# Patient Record
Sex: Female | Born: 1985
Health system: Southern US, Community
[De-identification: ages and names within clinical notes are randomized; demographics above are authoritative.]

## PROBLEM LIST (undated history)

## (undated) DIAGNOSIS — Z8669 Personal history of other diseases of the nervous system and sense organs: Secondary | ICD-10-CM

## (undated) DIAGNOSIS — R87629 Unspecified abnormal cytological findings in specimens from vagina: Secondary | ICD-10-CM

## (undated) HISTORY — PX: WISDOM TOOTH EXTRACTION: SHX21

## (undated) HISTORY — DX: Personal history of other diseases of the nervous system and sense organs: Z86.69

## (undated) HISTORY — DX: Unspecified abnormal cytological findings in specimens from vagina: R87.629

---

## 2009-12-30 ENCOUNTER — Ambulatory Visit: Payer: Self-pay | Admitting: Obstetrics and Gynecology

## 2011-10-06 ENCOUNTER — Ambulatory Visit: Payer: Self-pay | Admitting: Obstetrics and Gynecology

## 2011-10-06 LAB — URINALYSIS, COMPLETE
Blood: NEGATIVE
Leukocyte Esterase: NEGATIVE
Nitrite: NEGATIVE
Protein: NEGATIVE
Specific Gravity: 1.005 (ref 1.003–1.030)

## 2011-10-06 LAB — TSH: Thyroid Stimulating Horm: 2.53 u[IU]/mL

## 2011-10-06 LAB — CBC WITH DIFFERENTIAL/PLATELET
Basophil #: 0 10*3/uL (ref 0.0–0.1)
Eosinophil #: 0.1 10*3/uL (ref 0.0–0.7)
HGB: 12.8 g/dL (ref 12.0–16.0)
Lymphocyte #: 1.9 10*3/uL (ref 1.0–3.6)
MCH: 30.3 pg (ref 26.0–34.0)
MCV: 92 fL (ref 80–100)
Monocyte %: 8.1 %
Neutrophil #: 3.9 10*3/uL (ref 1.4–6.5)
Platelet: 210 10*3/uL (ref 150–440)
RBC: 4.22 10*6/uL (ref 3.80–5.20)
WBC: 6.4 10*3/uL (ref 3.6–11.0)

## 2011-10-06 LAB — HEMOGLOBIN A1C: Hemoglobin A1C: 4.9 % (ref 4.2–6.3)

## 2011-10-07 LAB — URINE CULTURE

## 2012-05-22 ENCOUNTER — Inpatient Hospital Stay: Payer: Self-pay | Admitting: Obstetrics and Gynecology

## 2012-05-23 LAB — HEMATOCRIT: HCT: 28.9 % — ABNORMAL LOW (ref 35.0–47.0)

## 2012-08-20 ENCOUNTER — Ambulatory Visit: Payer: Self-pay | Admitting: Surgery

## 2013-11-23 IMAGING — US ULTRASOUND RIGHT BREAST
1 series · 14 of 25 positions shown · non-contrast
Comparison: none

REASON FOR EXAM: RT BRST LUMP 12 TO 3
COMMENTS:

[Series 1: ultrasound right breast · 0.08mm/px · 14 of 43 slices shown]
[im 1/43]
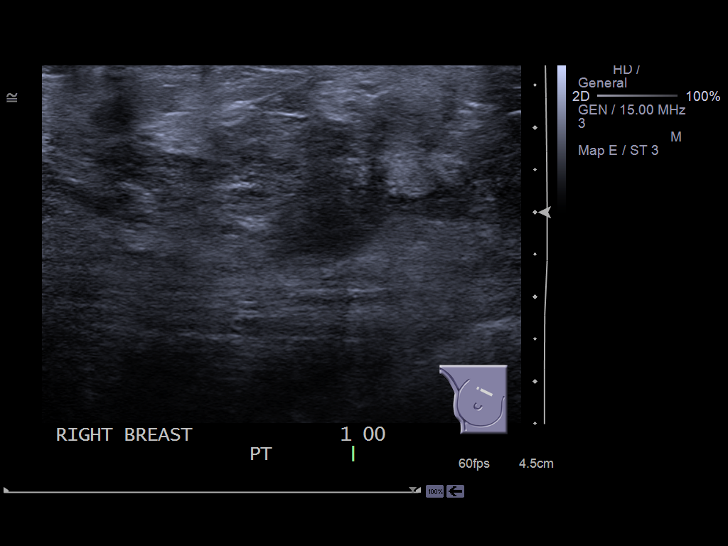
[im 4/43]
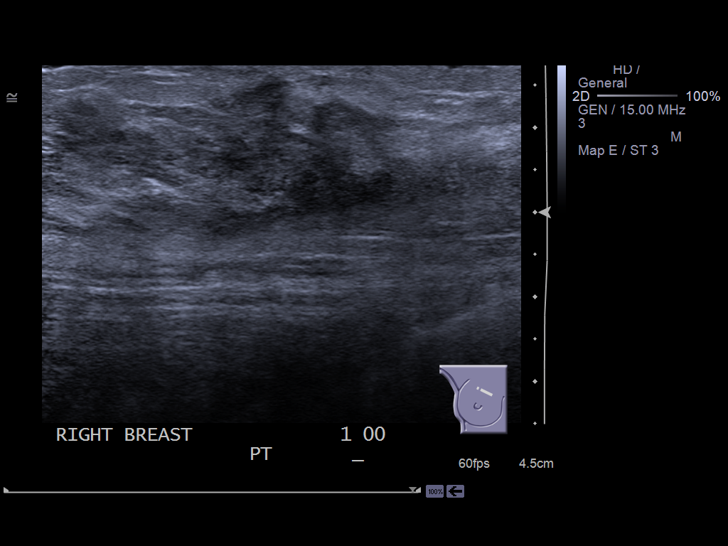
[im 8/43]
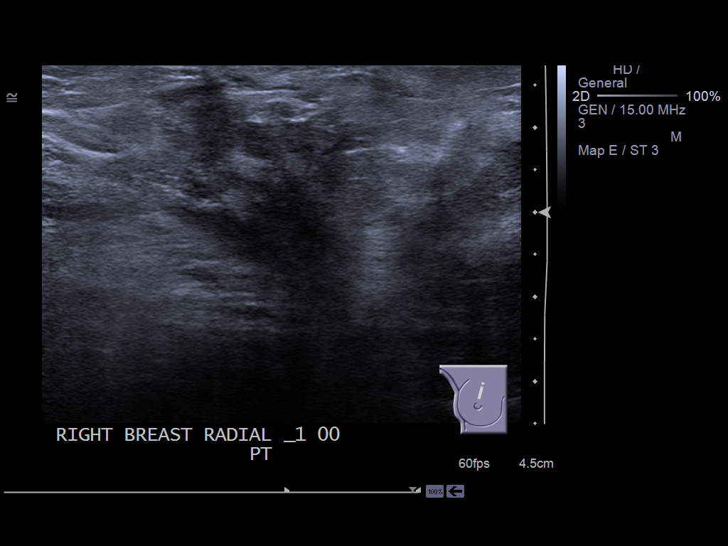
[im 11/43]
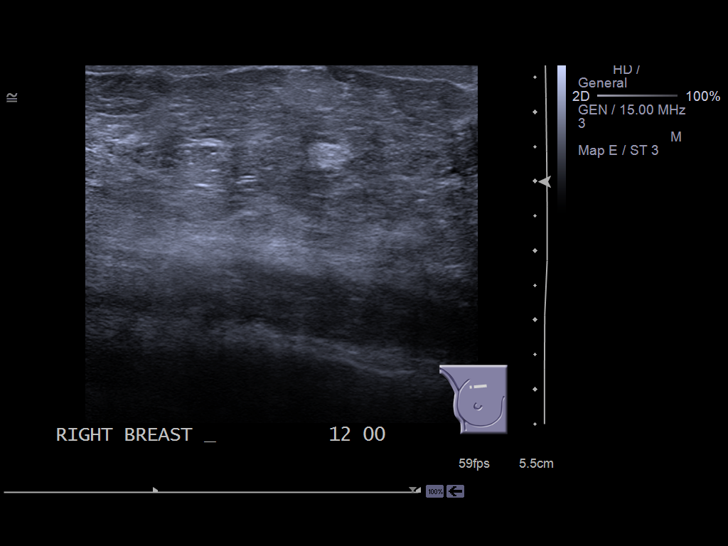
[im 15/43]
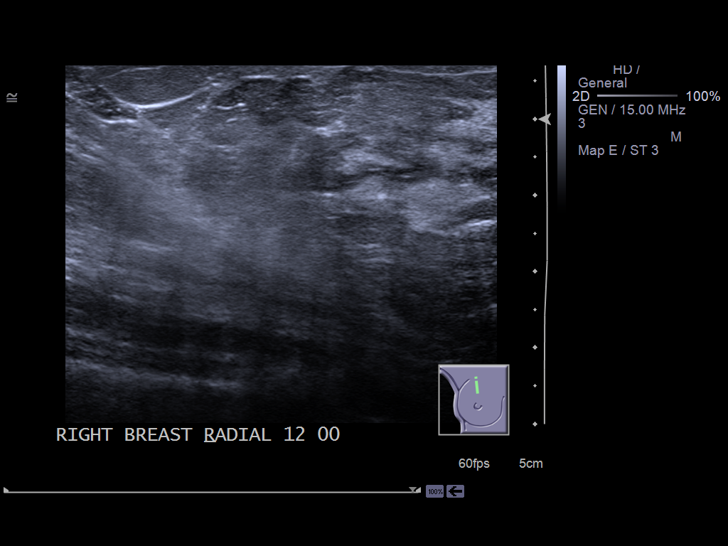
[im 16/43]
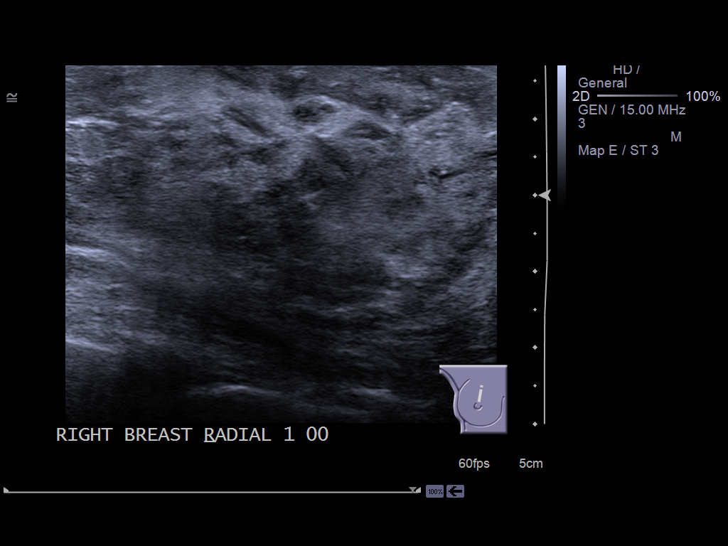
[im 20/43]
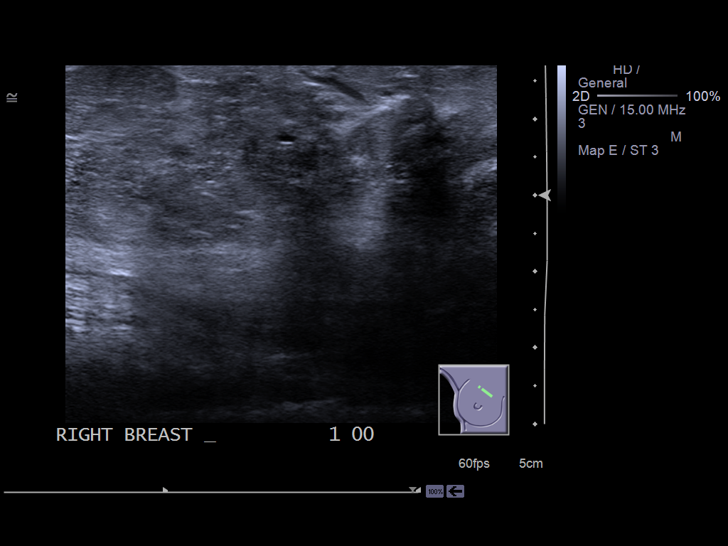
[im 23/43]
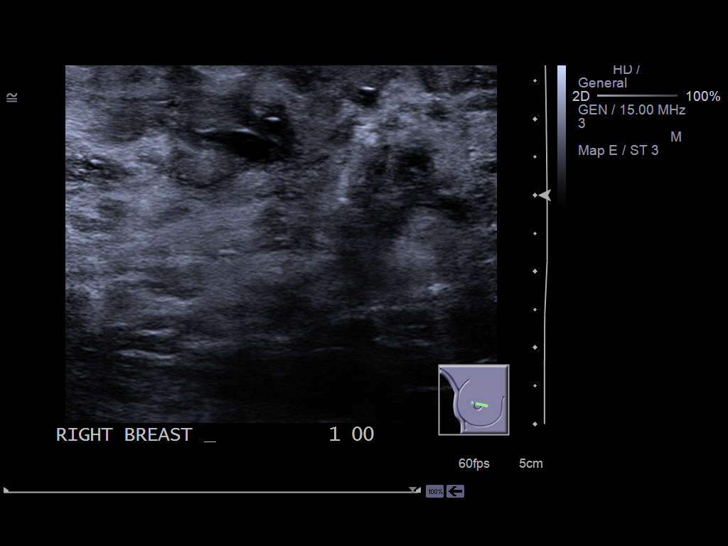
[im 27/43]
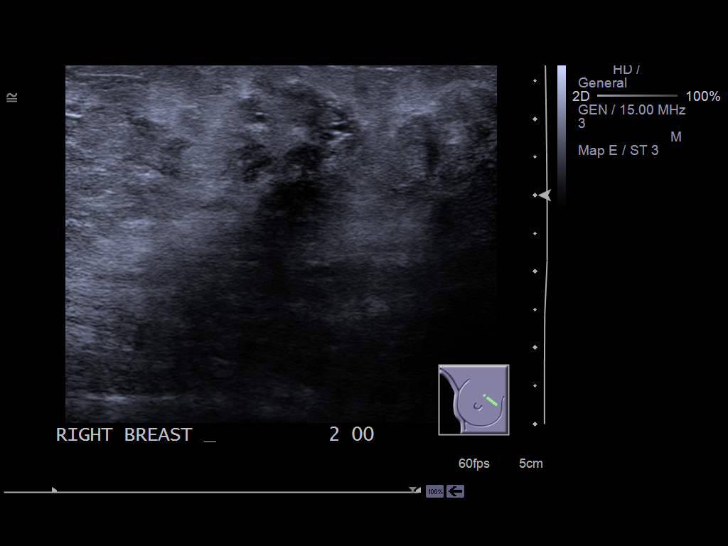
[im 29/43]
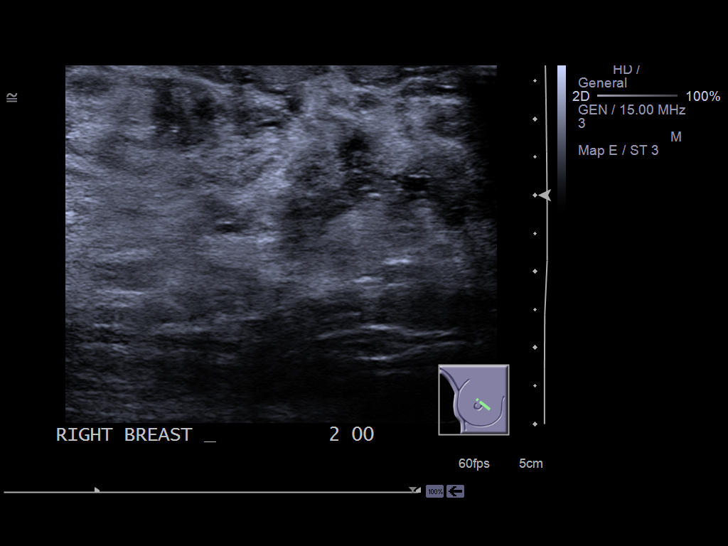
[im 32/43]
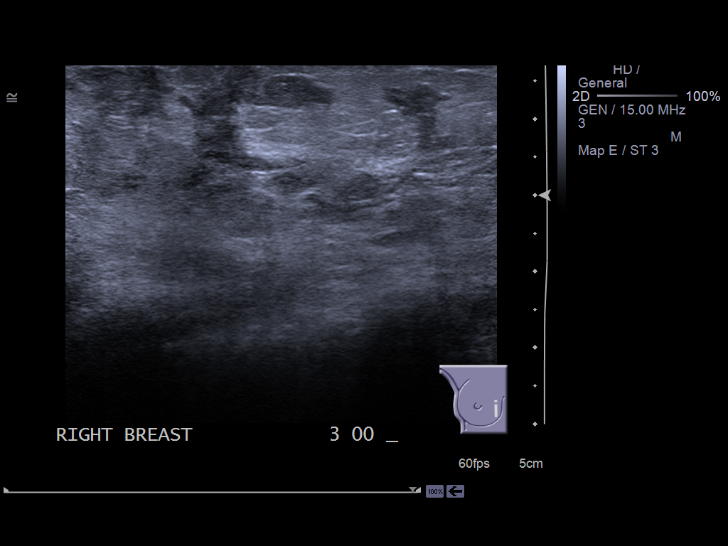
[im 36/43]
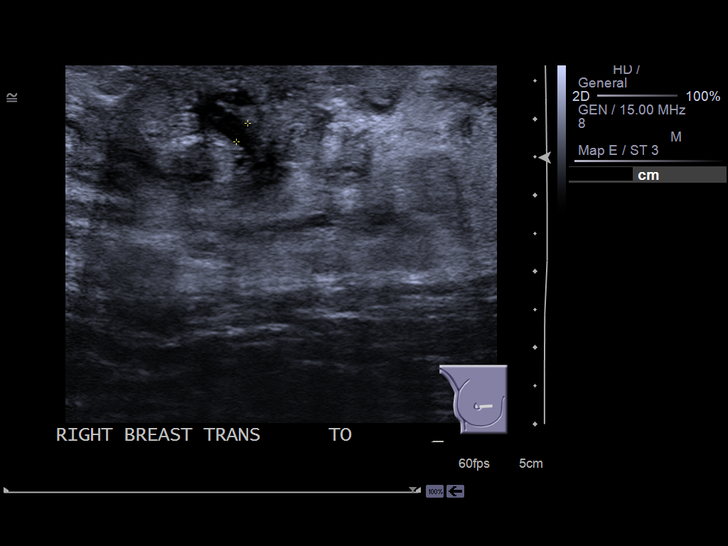
[im 39/43]
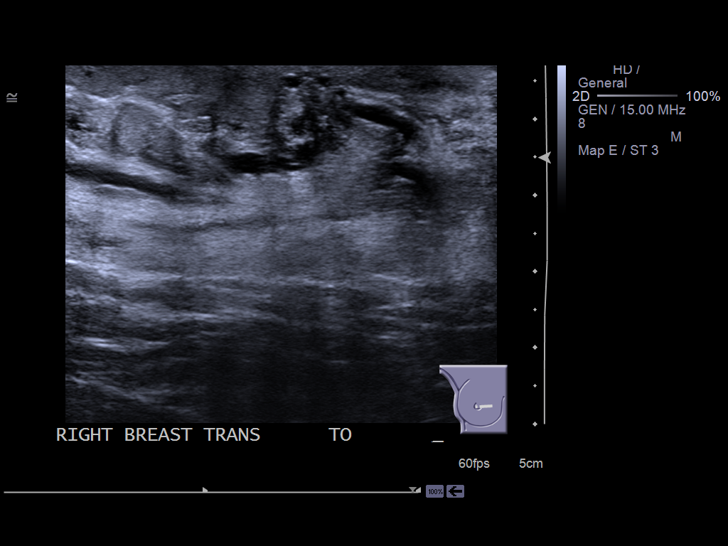
[im 43/43]
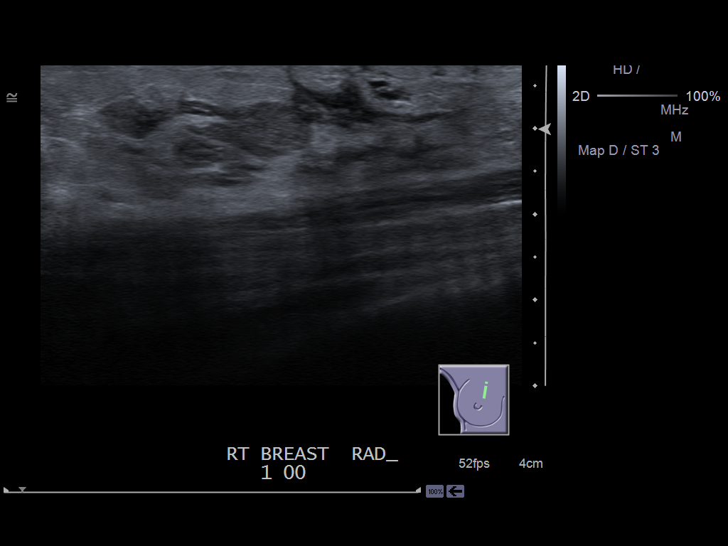

[14 of 25 positions shown; findings below may reference images not displayed]

PROCEDURE:     US  - US BREAST RIGHT  - August 20, 2012  [DATE]

RESULT:     Targeted ultrasound of the right breast upper medial aspect is
performed. The patient has an area of concern approximately the [DATE] region.
There is a dense parenchymal pattern. The patient feels that the area
concern is intermittent with waxing and waning periods. No definite
abnormality is seen in the area of concern. In the retroareolar region deep
to the nipple there is a prominent tubular structure measuring 2.8 mm
diameter suggestive of a dilated duct. The patient is currently
breast-feeding.
IMPRESSION: No definite mass evident. Please see above.

[REDACTED]

## 2014-03-03 ENCOUNTER — Encounter: Payer: Self-pay | Admitting: Family Medicine

## 2014-03-03 ENCOUNTER — Encounter (INDEPENDENT_AMBULATORY_CARE_PROVIDER_SITE_OTHER): Payer: Self-pay

## 2014-03-03 ENCOUNTER — Ambulatory Visit (INDEPENDENT_AMBULATORY_CARE_PROVIDER_SITE_OTHER): Payer: 59 | Admitting: Family Medicine

## 2014-03-03 VITALS — BP 126/80 | HR 70 | Temp 98.8°F | Ht 63.5 in | Wt 140.0 lb

## 2014-03-03 DIAGNOSIS — Z8669 Personal history of other diseases of the nervous system and sense organs: Secondary | ICD-10-CM

## 2014-03-03 NOTE — Progress Notes (Signed)
Subjective:    Patient ID: Caitlyn Blackburn, female    DOB: [birth year], 28 y.o.   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  Pretty regular hours - 8-4:30   Has one child - 48 month old little boy -- in day care  She is enjoying him  She is married - husb works in [employer]   Is very healthy  Hx of migraines -now and then - triggered from food / greasy food  Usually they get better with sleep   Pap was in July 2015  Will try for another pregnancy in another several years  Sees Dr Greggory Keen  Had one abn pap 2 y ago - then re check was fine  No smoking / no tab products  Did lab work a year ago - thyroid /lipid/cbc -all was normal   Does exercise - likes to run  Runs at lunchtime  Eats healthy also   Patient Active Problem List   Diagnosis Date Noted  . Hx of migraines 03/03/2014   Past Medical History  Diagnosis Date  . History of migraine    No past surgical history on file. History  Substance Use Topics  . Smoking status: Never Smoker   . Smokeless tobacco: Not on file  . Alcohol Use: Yes     Comment: occ   Family History  Problem Relation Age of Onset  . Alcohol abuse Maternal Grandfather   . Arthritis Maternal Grandmother   . Breast cancer Paternal Grandmother   . Breast cancer Maternal Aunt   . Prostate cancer Maternal Grandfather   . Hyperlipidemia Maternal Grandmother   . Hypertension Maternal Grandmother    No Known Allergies No current outpatient prescriptions on file prior to visit.   No current facility-administered medications on file prior to visit.     Review of Systems Review of Systems  Constitutional: Negative for fever, appetite change, fatigue and unexpected weight change.  Eyes: Negative for pain and visual disturbance.  Respiratory: Negative for cough and shortness of breath.   Cardiovascular: Negative for cp or palpitations      Gastrointestinal: Negative for nausea, diarrhea and constipation.  Genitourinary: Negative for urgency and frequency.  Skin: Negative for pallor or rash   Neurological: Negative for weakness, light-headedness, numbness and pos for occ headaches.  Hematological: Negative for adenopathy. Does not bruise/bleed easily.  Psychiatric/Behavioral: Negative for dysphoric mood. The patient is not nervous/anxious.         Objective:   Physical Exam  Constitutional: She appears well-developed and well-nourished. No distress.  HENT:  Head: Normocephalic and atraumatic.  Right Ear: External ear normal.  Left Ear: External ear normal.  Nose: Nose normal.  Mouth/Throat: Oropharynx is clear and moist.  Eyes: Conjunctivae and EOM are normal. Pupils are equal, round, and reactive to light. Right eye exhibits no discharge. Left eye exhibits no discharge. No scleral icterus.  Neck: Normal range of motion. Neck supple. No JVD present. No thyromegaly present.  Cardiovascular: Normal rate, regular rhythm, normal heart sounds and intact distal pulses.  Exam reveals no gallop.   Pulmonary/Chest: Effort normal and breath sounds normal. No respiratory distress. She has no wheezes. She has no rales.  Abdominal: Soft. Bowel sounds are normal. She exhibits no distension and no mass. There is no tenderness.  Musculoskeletal: She exhibits no edema and no tenderness.  Lymphadenopathy:    She has no cervical adenopathy.  Neurological: She is alert. She has normal reflexes. No cranial nerve deficit. She exhibits normal muscle tone. Coordination normal.  Skin: Skin is warm and dry. No rash noted. No erythema. No pallor.  Psychiatric: She has a normal mood and affect.          Assessment & Plan:   Problem List Items Addressed This Visit     Other   Hx of migraines - Primary     Triggered by food- urged to keep a diet journal  Disc ha prev with lifestyle change -imp of regular sleep habits and hydration and  caffeine avoidance

## 2014-03-03 NOTE — Patient Instructions (Signed)
Take care of yourself  Drink lots of water  Keep exercising  Don't overuse caffeine  Make sure to get enough sleep  Please call for records from Dr Greggory Keen   Migraine Headache A migraine headache is an intense, throbbing pain on one or both sides of your head. A migraine can last for 30 minutes to several hours. CAUSES  The exact cause of a migraine headache is not always known. However, a migraine may be caused when nerves in the brain become irritated and release chemicals that cause inflammation. This causes pain. Certain things may also trigger migraines, such as:  Alcohol.  Smoking.  Stress.  Menstruation.  Aged cheeses.  Foods or drinks that contain nitrates, glutamate, aspartame, or tyramine.  Lack of sleep.  Chocolate.  Caffeine.  Hunger.  Physical exertion.  Fatigue.  Medicines used to treat chest pain (nitroglycerine), birth control pills, estrogen, and some blood pressure medicines. SIGNS AND SYMPTOMS  Pain on one or both sides of your head.  Pulsating or throbbing pain.  Severe pain that prevents daily activities.  Pain that is aggravated by any physical activity.  Nausea, vomiting, or both.  Dizziness.  Pain with exposure to bright lights, loud noises, or activity.  General sensitivity to bright lights, loud noises, or smells. Before you get a migraine, you may get warning signs that a migraine is coming (aura). An aura may include:  Seeing flashing lights.  Seeing bright spots, halos, or zigzag lines.  Having tunnel vision or blurred vision.  Having feelings of numbness or tingling.  Having trouble talking.  Having muscle weakness. DIAGNOSIS  A migraine headache is often diagnosed based on:  Symptoms.  Physical exam.  A CT scan or MRI of your head. These imaging tests cannot diagnose migraines, but they can help rule out other causes of headaches. TREATMENT Medicines may be given for pain and nausea. Medicines can also be  given to help prevent recurrent migraines.  HOME CARE INSTRUCTIONS  Only take over-the-counter or prescription medicines for pain or discomfort as directed by your health care provider. The use of long-term narcotics is not recommended.  Lie down in a dark, quiet room when you have a migraine.  Keep a journal to find out what may trigger your migraine headaches. For example, write down:  What you eat and drink.  How much sleep you get.  Any change to your diet or medicines.  Limit alcohol consumption.  Quit smoking if you smoke.  Get 7-9 hours of sleep, or as recommended by your health care provider.  Limit stress.  Keep lights dim if bright lights bother you and make your migraines worse. SEEK IMMEDIATE MEDICAL CARE IF:   Your migraine becomes severe.  You have a fever.  You have a stiff neck.  You have vision loss.  You have muscular weakness or loss of muscle control.  You start losing your balance or have trouble walking.  You feel faint or pass out.  You have severe symptoms that are different from your first symptoms. MAKE SURE YOU:   Understand these instructions.  Will watch your condition.  Will get help right away if you are not doing well or get worse. Document Released: 05/29/2005 Document Revised: 10/13/2013 Document Reviewed: 02/03/2013 [facility] Patient Information 2015 [place], [place]. This information is not intended to replace advice given to you by your health care provider. Make sure you discuss any questions you have with your health care provider.

## 2014-03-03 NOTE — Progress Notes (Signed)
Pre visit review using our clinic review tool, if applicable. No additional management support is needed unless otherwise documented below in the visit note. 

## 2014-03-05 NOTE — Assessment & Plan Note (Signed)
Triggered by food- urged to keep a diet journal  Disc ha prev with lifestyle change -imp of regular sleep habits and hydration and caffeine avoidance

## 2014-11-20 ENCOUNTER — Encounter: Payer: Self-pay | Admitting: Family Medicine

## 2014-11-20 ENCOUNTER — Telehealth: Payer: Self-pay | Admitting: Family Medicine

## 2014-11-20 MED ORDER — SUMATRIPTAN SUCCINATE 100 MG PO TABS: 100.0000 mg | ORAL_TABLET | ORAL | Status: DC | PRN

## 2014-11-20 NOTE — Telephone Encounter (Signed)
Pt needs imitrex sent to her pharmacy

## 2015-01-14 ENCOUNTER — Encounter: Payer: Self-pay | Admitting: Obstetrics and Gynecology

## 2015-02-04 ENCOUNTER — Encounter: Payer: Self-pay | Admitting: Obstetrics and Gynecology

## 2015-02-04 ENCOUNTER — Ambulatory Visit (INDEPENDENT_AMBULATORY_CARE_PROVIDER_SITE_OTHER): Payer: 59 | Admitting: Obstetrics and Gynecology

## 2015-02-04 VITALS — BP 134/80 | HR 132 | Ht 63.0 in | Wt 146.9 lb

## 2015-02-04 DIAGNOSIS — Z01419 Encounter for gynecological examination (general) (routine) without abnormal findings: Secondary | ICD-10-CM | POA: Diagnosis not present

## 2015-02-04 NOTE — Progress Notes (Signed)
Patient ID: Caitlyn Blackburn, female   DOB: Aug 11, 1985, 29 y.o.   MRN: [id number] ANNUAL PREVENTATIVE CARE GYN  ENCOUNTER NOTE  Subjective:       Caitlyn Blackburn is a 29 y.o. No obstetric history on file. female here for a routine annual gynecologic exam.  Current complaints: 1.  none  2.  Attempting conception; on prenatal vitamins   Gynecologic History Patient's last menstrual period was 01/21/2015 (exact date). Contraception: none Last Pap: 01/08/2014 neg. Results were: normal  Obstetric History Para 1001  Past Medical History  Diagnosis Date  . History of migraine   . Abnormal Pap smear of vagina     10 years ago    Past Surgical History  Procedure Laterality Date  . Wisdom tooth extraction      Current Outpatient Prescriptions on File Prior to Visit  Medication Sig Dispense Refill  . Prenatal Vit-Fe Fumarate-FA (PRENATAL MULTIVITAMIN) TABS tablet Take 1 tablet by mouth daily.    . SUMAtriptan (IMITREX) 100 MG tablet Take 1 tablet (100 mg total) by mouth every 2 (two) hours as needed for migraine. May repeat in 2 hours if headache persists or recurs. 10 tablet 3   No current facility-administered medications on file prior to visit.    No Known Allergies  Social History   Social History  . Marital Status: Married    Spouse Name: N/A  . Number of Children: N/A  . Years of Education: N/A   Occupational History  . Not on file.   Social History Main Topics  . Smoking status: Never Smoker   . Smokeless tobacco: Not on file  . Alcohol Use: Yes     Comment: occ  . Drug Use: No  . Sexual Activity: Yes    Birth Control/ Protection: None   Other Topics Concern  . Not on file   Social History Narrative    Family History  Problem Relation Age of Onset  . Alcohol abuse Maternal Grandfather   . Prostate cancer Maternal Grandfather   . Arthritis Maternal Grandmother   . Hyperlipidemia Maternal Grandmother   . Hypertension Maternal Grandmother   . Breast cancer  Paternal Grandmother   . Heart disease Paternal Grandmother   . Breast cancer Maternal Aunt   . Colon cancer Neg Hx   . Ovarian cancer Neg Hx   . Diabetes Neg Hx     The following portions of the patient's history were reviewed and updated as appropriate: allergies, current medications, past family history, past medical history, past social history, past surgical history and problem list.  Review of Systems ROS Review of Systems - General ROS: negative for - chills, fatigue, fever, hot flashes, night sweats, weight gain or weight loss Psychological ROS: negative for - anxiety, decreased libido, depression, mood swings, physical abuse or sexual abuse Ophthalmic ROS: negative for - blurry vision, eye pain or loss of vision ENT ROS: negative for - headaches, hearing change, visual changes or vocal changes Allergy and Immunology ROS: negative for - hives, itchy/watery eyes or seasonal allergies Hematological and Lymphatic ROS: negative for - bleeding problems, bruising, swollen lymph nodes or weight loss Endocrine ROS: negative for - galactorrhea, hair pattern changes, hot flashes, malaise/lethargy, mood swings, palpitations, polydipsia/polyuria, skin changes, temperature intolerance or unexpected weight changes Breast ROS: negative for - new or changing breast lumps or nipple discharge Respiratory ROS: negative for - cough or shortness of breath Cardiovascular ROS: negative for - chest pain, irregular heartbeat, palpitations or shortness of breath  Gastrointestinal ROS: no abdominal pain, change in bowel habits, or black or bloody stools Genito-Urinary ROS: no dysuria, trouble voiding, or hematuria Musculoskeletal ROS: negative for - joint pain or joint stiffness Neurological ROS: negative for - bowel and bladder control changes Dermatological ROS: negative for rash and skin lesion changes   Objective:   BP 134/80 mmHg  Pulse 132  Ht 5\' 3"  (1.6 m)  Wt 146 lb 14.4 oz (66.633 kg)  BMI  26.03 kg/m2  LMP 01/21/2015 (Exact Date) CONSTITUTIONAL: Well-developed, well-nourished female in no acute distress.  PSYCHIATRIC: Normal mood and affect. Normal behavior. Normal judgment and thought content. NEUROLGIC: Alert and oriented to person, place, and time. Normal muscle tone coordination. No cranial nerve deficit noted. HENT:  Normocephalic, atraumatic, External right and left ear normal. Oropharynx is clear and moist EYES: Conjunctivae and EOM are normal. Pupils are equal, round, and reactive to light. No scleral icterus.  NECK: Normal range of motion, supple, no masses.  Normal thyroid.  SKIN: Skin is warm and dry. No rash noted. Not diaphoretic. No erythema. No pallor. CARDIOVASCULAR: Normal heart rate noted, regular rhythm, no murmur. RESPIRATORY: Clear to auscultation bilaterally. Effort and breath sounds normal, no problems with respiration noted. BREASTS: Symmetric in size. No masses, skin changes, nipple drainage, or lymphadenopathy. ABDOMEN: Soft, normal bowel sounds, no distention noted.  No tenderness, rebound or guarding.  BLADDER: Normal PELVIC:  External Genitalia: Normal  BUS: Normal  Vagina: Normal  Cervix: Normal  Uterus: Normal; Anteverted, mobile, normal size and shape  Adnexa: Normal  RV: External Exam NormaI  MUSCULOSKELETAL: Normal range of motion. No tenderness.  No cyanosis, clubbing, or edema.  2+ distal pulses. LYMPHATIC: No Axillary, Supraclavicular, or Inguinal Adenopathy.    Assessment:   Annual gynecologic examination 29 y.o. Contraception: none Normal BMI History of migraine headaches. Desires conception.  Plan:  Pap: Pap, Reflex if ASCUS Mammogram: Not Indicated Stool Guaiac Testing:  Not Indicated Labs: lipid, vit d, fbs, a1c,tsh Routine preventative health maintenance measures emphasized: preconception counseling, Exercise/Diet/Weight control, Tobacco Warnings and Alcohol/Substance use risks Continue prenatal vitamins daily Return  to Clinic - 1 [address] [place], CMA  Herold Harms, MD

## 2015-02-04 NOTE — Patient Instructions (Addendum)
1. Pap smear done. 2.  Preconception counseling.  Instructions given. 3.  Continue prenatal vitamins daily. 4.  Return in 1 year or when conception occurs.

## 2015-02-05 ENCOUNTER — Other Ambulatory Visit: Payer: 59

## 2015-02-05 ENCOUNTER — Other Ambulatory Visit: Payer: Self-pay

## 2015-02-05 DIAGNOSIS — Z01419 Encounter for gynecological examination (general) (routine) without abnormal findings: Secondary | ICD-10-CM

## 2015-02-06 LAB — LIPID PANEL
CHOL/HDL RATIO: 2.1 ratio (ref 0.0–4.4)
Cholesterol, Total: 159 mg/dL (ref 100–199)
HDL: 76 mg/dL (ref >39)
LDL Calculated: 75 mg/dL (ref 0–99)
Triglycerides: 40 mg/dL (ref 0–149)
VLDL Cholesterol Cal: 8 mg/dL (ref 5–40)

## 2015-02-06 LAB — VITAMIN D 25 HYDROXY (VIT D DEFICIENCY, FRACTURES): Vit D, 25-Hydroxy: 33 ng/mL (ref 30.0–100.0)

## 2015-02-06 LAB — GLUCOSE, RANDOM: Glucose: 90 mg/dL (ref 65–99)

## 2015-02-06 LAB — HEMOGLOBIN A1C
ESTIMATED AVERAGE GLUCOSE: 105 mg/dL
HEMOGLOBIN A1C: 5.3 % (ref 4.8–5.6)

## 2015-02-06 LAB — TSH: TSH: 1.59 u[IU]/mL (ref 0.450–4.500)

## 2015-02-08 LAB — PAP IG W/ RFLX HPV ASCU: PAP SMEAR COMMENT: 0

## 2016-03-06 NOTE — Progress Notes (Deleted)
ANNUAL PREVENTATIVE CARE GYN  ENCOUNTER NOTE  Subjective:       Len BlalockHayley N Dewitt is a 30 y.o. No obstetric history on file. female here for a routine annual gynecologic exam.  Current complaints: 1.      Gynecologic History No LMP recorded. Contraception: none Last Pap: 01/2015 neg. Results were: normal Last mammogram: n/a. Results were:   Obstetric History OB History  No data available    Past Medical History:  Diagnosis Date  . Abnormal Pap smear of vagina    10 years ago  . History of migraine     Past Surgical History:  Procedure Laterality Date  . WISDOM TOOTH EXTRACTION      Current Outpatient Prescriptions on File Prior to Visit  Medication Sig Dispense Refill  . Prenatal Vit-Fe Fumarate-FA (PRENATAL MULTIVITAMIN) TABS tablet Take 1 tablet by mouth daily.    . SUMAtriptan (IMITREX) 100 MG tablet Take 1 tablet (100 mg total) by mouth every 2 (two) hours as needed for migraine. May repeat in 2 hours if headache persists or recurs. 10 tablet 3   No current facility-administered medications on file prior to visit.     No Known Allergies  Social History   Social History  . Marital status: Married    Spouse name: N/A  . Number of children: N/A  . Years of education: N/A   Occupational History  . Not on file.   Social History Main Topics  . Smoking status: Never Smoker  . Smokeless tobacco: Not on file  . Alcohol use Yes     Comment: occ  . Drug use: No  . Sexual activity: Yes    Birth control/ protection: None   Other Topics Concern  . Not on file   Social History Narrative  . No narrative on file    Family History  Problem Relation Age of Onset  . Alcohol abuse Maternal Grandfather   . Prostate cancer Maternal Grandfather   . Arthritis Maternal Grandmother   . Hyperlipidemia Maternal Grandmother   . Hypertension Maternal Grandmother   . Breast cancer Paternal Grandmother   . Heart disease Paternal Grandmother   . Breast cancer Maternal Aunt    . Colon cancer Neg Hx   . Ovarian cancer Neg Hx   . Diabetes Neg Hx     The following portions of the patient's history were reviewed and updated as appropriate: allergies, current medications, past family history, past medical history, past social history, past surgical history and problem list.  Review of Systems ROS Review of Systems - General ROS: negative for - chills, fatigue, fever, hot flashes, night sweats, weight gain or weight loss Psychological ROS: negative for - anxiety, decreased libido, depression, mood swings, physical abuse or sexual abuse Ophthalmic ROS: negative for - blurry vision, eye pain or loss of vision ENT ROS: negative for - headaches, hearing change, visual changes or vocal changes Allergy and Immunology ROS: negative for - hives, itchy/watery eyes or seasonal allergies Hematological and Lymphatic ROS: negative for - bleeding problems, bruising, swollen lymph nodes or weight loss Endocrine ROS: negative for - galactorrhea, hair pattern changes, hot flashes, malaise/lethargy, mood swings, palpitations, polydipsia/polyuria, skin changes, temperature intolerance or unexpected weight changes Breast ROS: negative for - new or changing breast lumps or nipple discharge Respiratory ROS: negative for - cough or shortness of breath Cardiovascular ROS: negative for - chest pain, irregular heartbeat, palpitations or shortness of breath Gastrointestinal ROS: no abdominal pain, change in bowel habits, or black  or bloody stools Genito-Urinary ROS: no dysuria, trouble voiding, or hematuria Musculoskeletal ROS: negative for - joint pain or joint stiffness Neurological ROS: negative for - bowel and bladder control changes Dermatological ROS: negative for rash and skin lesion changes   Objective:   There were no vitals taken for this visit. CONSTITUTIONAL: Well-developed, well-nourished female in no acute distress.  PSYCHIATRIC: Normal mood and affect. Normal behavior. Normal  judgment and thought content. NEUROLGIC: Alert and oriented to person, place, and time. Normal muscle tone coordination. No cranial nerve deficit noted. HENT:  Normocephalic, atraumatic, External right and left ear normal. Oropharynx is clear and moist EYES: Conjunctivae and EOM are normal. Pupils are equal, round, and reactive to light. No scleral icterus.  NECK: Normal range of motion, supple, no masses.  Normal thyroid.  SKIN: Skin is warm and dry. No rash noted. Not diaphoretic. No erythema. No pallor. CARDIOVASCULAR: Normal heart rate noted, regular rhythm, no murmur. RESPIRATORY: Clear to auscultation bilaterally. Effort and breath sounds normal, no problems with respiration noted. BREASTS: Symmetric in size. No masses, skin changes, nipple drainage, or lymphadenopathy. ABDOMEN: Soft, normal bowel sounds, no distention noted.  No tenderness, rebound or guarding.  BLADDER: Normal PELVIC:  External Genitalia: Normal  BUS: Normal  Vagina: Normal  Cervix: Normal  Uterus: Normal  Adnexa: Normal  RV: {Blank multiple:19196::"External Exam NormaI","No Rectal Masses","Normal Sphincter tone"}  MUSCULOSKELETAL: Normal range of motion. No tenderness.  No cyanosis, clubbing, or edema.  2+ distal pulses. LYMPHATIC: No Axillary, Supraclavicular, or Inguinal Adenopathy.    Assessment:   Annual gynecologic examination 30 y.o. Contraception: none [id number] Problem List Items Addressed This Visit    None    Visit Diagnoses    Well woman exam with routine gynecological exam    -  Primary      Plan:  Pap: Pap, Reflex if ASCUS Mammogram: Not Indicated Stool Guaiac Testing:  Not Indicated Labs: Lipid 1 tsh fbs a1c vit d Routine preventative health maintenance measures emphasized: {Blank multiple:19196::"Exercise/Diet/Weight control","Tobacco Warnings","Alcohol/Substance use risks","Stress Management","Peer Pressure Issues","Safe Sex"} *** Return to Clinic - 1 [address] [place], [place]

## 2016-03-07 ENCOUNTER — Encounter: Payer: 59 | Admitting: Obstetrics and Gynecology

## 2016-04-03 ENCOUNTER — Encounter: Payer: Self-pay | Admitting: Obstetrics and Gynecology

## 2016-04-04 ENCOUNTER — Ambulatory Visit (INDEPENDENT_AMBULATORY_CARE_PROVIDER_SITE_OTHER): Payer: 59 | Admitting: Obstetrics and Gynecology

## 2016-04-04 ENCOUNTER — Encounter: Payer: Self-pay | Admitting: Obstetrics and Gynecology

## 2016-04-04 VITALS — BP 140/78 | HR 105 | Ht 63.0 in | Wt 151.1 lb

## 2016-04-04 DIAGNOSIS — Z01419 Encounter for gynecological examination (general) (routine) without abnormal findings: Secondary | ICD-10-CM | POA: Diagnosis not present

## 2016-04-04 NOTE — Patient Instructions (Addendum)
1. Pap smear is done 2. Self breast awareness encouraged 3. Continue with healthy eating and exercise 4. Screening labs are ordered 5. Return in 1 year    Health Maintenance, Female Adopting a healthy lifestyle and getting preventive care can go a long way to promote health and wellness. Talk with your health care provider about what schedule of regular examinations is right for you. This is a good chance for you to check in with your provider about disease prevention and staying healthy. In between checkups, there are plenty of things you can do on your own. Experts have done a lot of research about which lifestyle changes and preventive measures are most likely to keep you healthy. Ask your health care provider for more information. WEIGHT AND DIET  Eat a healthy diet  Be sure to include plenty of vegetables, fruits, low-fat dairy products, and lean protein.  Do not eat a lot of foods high in solid fats, added sugars, or salt.  Get regular exercise. This is one of the most important things you can do for your health.  Most adults should exercise for at least 150 minutes each week. The exercise should increase your heart rate and make you sweat (moderate-intensity exercise).  Most adults should also do strengthening exercises at least twice a week. This is in addition to the moderate-intensity exercise.  Maintain a healthy weight  Body mass index (BMI) is a measurement that can be used to identify possible weight problems. It estimates body fat based on height and weight. Your health care provider can help determine your BMI and help you achieve or maintain a healthy weight.  For females 40 years of age and older:   A BMI below 18.5 is considered underweight.  A BMI of 18.5 to 24.9 is normal.  A BMI of 25 to 29.9 is considered overweight.  A BMI of 30 and above is considered obese.  Watch levels of cholesterol and blood lipids  You should start having your blood tested for  lipids and cholesterol at 30 years of age, then have this test every 5 years.  You may need to have your cholesterol levels checked more often if:  Your lipid or cholesterol levels are high.  You are older than 30 years of age.  You are at high risk for heart disease.  CANCER SCREENING   Lung Cancer  Lung cancer screening is recommended for adults 68-13 years old who are at high risk for lung cancer because of a history of smoking.  A yearly low-dose CT scan of the lungs is recommended for people who:  Currently smoke.  Have quit within the past 15 years.  Have at least a 30-pack-year history of smoking. A pack year is smoking an average of one pack of cigarettes a day for 1 year.  Yearly screening should continue until it has been 15 years since you quit.  Yearly screening should stop if you develop a health problem that would prevent you from having lung cancer treatment.  Breast Cancer  Practice breast self-awareness. This means understanding how your breasts normally appear and feel.  It also means doing regular breast self-exams. Let your health care provider know about any changes, no matter how small.  If you are in your 20s or 30s, you should have a clinical breast exam (CBE) by a health care provider every 1-3 years as part of a regular health exam.  If you are 43 or older, have a CBE every year. Also  consider having a breast X-ray (mammogram) every year.  If you have a family history of breast cancer, talk to your health care provider about genetic screening.  If you are at high risk for breast cancer, talk to your health care provider about having an MRI and a mammogram every year.  Breast cancer gene (BRCA) assessment is recommended for women who have family members with BRCA-related cancers. BRCA-related cancers include:  Breast.  Ovarian.  Tubal.  Peritoneal cancers.  Results of the assessment will determine the need for genetic counseling and BRCA1  and BRCA2 testing. Cervical Cancer Your health care provider may recommend that you be screened regularly for cancer of the pelvic organs (ovaries, uterus, and vagina). This screening involves a pelvic examination, including checking for microscopic changes to the surface of your cervix (Pap test). You may be encouraged to have this screening done every 3 years, beginning at age 49.  For women ages 40-65, health care providers may recommend pelvic exams and Pap testing every 3 years, or they may recommend the Pap and pelvic exam, combined with testing for human papilloma virus (HPV), every 5 years. Some types of HPV increase your risk of cervical cancer. Testing for HPV may also be done on women of any age with unclear Pap test results.  Other health care providers may not recommend any screening for nonpregnant women who are considered low risk for pelvic cancer and who do not have symptoms. Ask your health care provider if a screening pelvic exam is right for you.  If you have had past treatment for cervical cancer or a condition that could lead to cancer, you need Pap tests and screening for cancer for at least 20 years after your treatment. If Pap tests have been discontinued, your risk factors (such as having a new sexual partner) need to be reassessed to determine if screening should resume. Some women have medical problems that increase the chance of getting cervical cancer. In these cases, your health care provider may recommend more frequent screening and Pap tests. Colorectal Cancer  This type of cancer can be detected and often prevented.  Routine colorectal cancer screening usually begins at 30 years of age and continues through 30 years of age.  Your health care provider may recommend screening at an earlier age if you have risk factors for colon cancer.  Your health care provider may also recommend using home test kits to check for hidden blood in the stool.  A small camera at the  end of a tube can be used to examine your colon directly (sigmoidoscopy or colonoscopy). This is done to check for the earliest forms of colorectal cancer.  Routine screening usually begins at age 46.  Direct examination of the colon should be repeated every 5-10 years through 30 years of age. However, you may need to be screened more often if early forms of precancerous polyps or small growths are found. Skin Cancer  Check your skin from head to toe regularly.  Tell your health care provider about any new moles or changes in moles, especially if there is a change in a mole's shape or color.  Also tell your health care provider if you have a mole that is larger than the size of a pencil eraser.  Always use sunscreen. Apply sunscreen liberally and repeatedly throughout the day.  Protect yourself by wearing long sleeves, pants, a wide-brimmed hat, and sunglasses whenever you are outside. HEART DISEASE, DIABETES, AND HIGH BLOOD PRESSURE   High  blood pressure causes heart disease and increases the risk of stroke. High blood pressure is more likely to develop in:  People who have blood pressure in the high end of the normal range (130-139/85-89 mm Hg).  People who are overweight or obese.  People who are African American.  If you are 12-48 years of age, have your blood pressure checked every 3-5 years. If you are 55 years of age or older, have your blood pressure checked every year. You should have your blood pressure measured twice--once when you are at a hospital or clinic, and once when you are not at a hospital or clinic. Record the average of the two measurements. To check your blood pressure when you are not at a hospital or clinic, you can use:  An automated blood pressure machine at a pharmacy.  A home blood pressure monitor.  If you are between 50 years and 36 years old, ask your health care provider if you should take aspirin to prevent strokes.  Have regular diabetes  screenings. This involves taking a blood sample to check your fasting blood sugar level.  If you are at a normal weight and have a low risk for diabetes, have this test once every three years after 30 years of age.  If you are overweight and have a high risk for diabetes, consider being tested at a younger age or more often. PREVENTING INFECTION  Hepatitis B  If you have a higher risk for hepatitis B, you should be screened for this virus. You are considered at high risk for hepatitis B if:  You were born in a country where hepatitis B is common. Ask your health care provider which countries are considered high risk.  Your parents were born in a high-risk country, and you have not been immunized against hepatitis B (hepatitis B vaccine).  You have HIV or AIDS.  You use needles to inject street drugs.  You live with someone who has hepatitis B.  You have had sex with someone who has hepatitis B.  You get hemodialysis treatment.  You take certain medicines for conditions, including cancer, organ transplantation, and autoimmune conditions. Hepatitis C  Blood testing is recommended for:  Everyone born from [birth year] through 1965.  Anyone with known risk factors for hepatitis C. Sexually transmitted infections (STIs)  You should be screened for sexually transmitted infections (STIs) including gonorrhea and chlamydia if:  You are sexually active and are younger than 30 years of age.  You are older than 30 years of age and your health care provider tells you that you are at risk for this type of infection.  Your sexual activity has changed since you were last screened and you are at an increased risk for chlamydia or gonorrhea. Ask your health care provider if you are at risk.  If you do not have HIV, but are at risk, it may be recommended that you take a prescription medicine daily to prevent HIV infection. This is called pre-exposure prophylaxis (PrEP). You are considered at risk  if:  You are sexually active and do not regularly use condoms or know the HIV status of your partner(s).  You take drugs by injection.  You are sexually active with a partner who has HIV. Talk with your health care provider about whether you are at high risk of being infected with HIV. If you choose to begin PrEP, you should first be tested for HIV. You should then be tested every 3 months for as long  as you are taking PrEP.  PREGNANCY   If you are premenopausal and you may become pregnant, ask your health care provider about preconception counseling.  If you may become pregnant, take 400 to 800 micrograms (mcg) of folic acid every day.  If you want to prevent pregnancy, talk to your health care provider about birth control (contraception). OSTEOPOROSIS AND MENOPAUSE   Osteoporosis is a disease in which the bones lose minerals and strength with aging. This can result in serious bone fractures. Your risk for osteoporosis can be identified using a bone density scan.  If you are 16 years of age or older, or if you are at risk for osteoporosis and fractures, ask your health care provider if you should be screened.  Ask your health care provider whether you should take a calcium or vitamin D supplement to lower your risk for osteoporosis.  Menopause may have certain physical symptoms and risks.  Hormone replacement therapy may reduce some of these symptoms and risks. Talk to your health care provider about whether hormone replacement therapy is right for you.  HOME CARE INSTRUCTIONS   Schedule regular health, dental, and eye exams.  Stay current with your immunizations.   Do not use any tobacco products including cigarettes, chewing tobacco, or electronic cigarettes.  If you are pregnant, do not drink alcohol.  If you are breastfeeding, limit how much and how often you drink alcohol.  Limit alcohol intake to no more than 1 drink per day for nonpregnant women. One drink equals 12  ounces of beer, 5 ounces of wine, or 1 ounces of hard liquor.  Do not use street drugs.  Do not share needles.  Ask your health care provider for help if you need support or information about quitting drugs.  Tell your health care provider if you often feel depressed.  Tell your health care provider if you have ever been abused or do not feel safe at home.   This information is not intended to replace advice given to you by your health care provider. Make sure you discuss any questions you have with your health care provider.   Document Released: 12/12/2010 Document Revised: 06/19/2014 Document Reviewed: 04/30/2013 Elsevier Interactive Patient Education [employer].

## 2016-04-04 NOTE — Progress Notes (Signed)
ANNUAL PREVENTATIVE CARE GYN  ENCOUNTER NOTE  Subjective:       Caitlyn Blackburn is a 30 y.o. G4P1001 female here for a routine annual gynecologic exam.  Current complaints: 1.  NONE  2. Attempting conception off and on for the past year, on prenatal vitamins.  Bowels and bladder functioning normally.   Gynecologic History Patient's last menstrual period was 04/03/2016 (exact date). Contraception: none Last Pap: 01/2015 NEG. Results were: normal Last mammogram: N/A.   Obstetric History OB History  Gravida Para Term Preterm AB Living  1 1 1     1   SAB TAB Ectopic Multiple Live Births          1    # Outcome Date GA Lbr Len/2nd Weight Sex Delivery Anes PTL Lv  1 Term 2013   7 lb 1.6 oz (3.221 kg) M Vag-Spont   LIV      Past Medical History:  Diagnosis Date  . Abnormal Pap smear of vagina    10 years ago  . History of migraine     Past Surgical History:  Procedure Laterality Date  . WISDOM TOOTH EXTRACTION      Current Outpatient Prescriptions on File Prior to Visit  Medication Sig Dispense Refill  . Prenatal Vit-Fe Fumarate-FA (PRENATAL MULTIVITAMIN) TABS tablet Take 1 tablet by mouth daily.    . SUMAtriptan (IMITREX) 100 MG tablet Take 1 tablet (100 mg total) by mouth every 2 (two) hours as needed for migraine. May repeat in 2 hours if headache persists or recurs. 10 tablet 3   No current facility-administered medications on file prior to visit.     No Known Allergies  Social History   Social History  . Marital status: Married    Spouse name: N/A  . Number of children: N/A  . Years of education: N/A   Occupational History  . Not on file.   Social History Main Topics  . Smoking status: Never Smoker  . Smokeless tobacco: Never Used  . Alcohol use Yes     Comment: occ  . Drug use: No  . Sexual activity: Yes    Birth control/ protection: None   Other Topics Concern  . Not on file   Social History Narrative  . No narrative on file    Family History   Problem Relation Age of Onset  . Alcohol abuse Maternal Grandfather   . Prostate cancer Maternal Grandfather   . Arthritis Maternal Grandmother   . Hyperlipidemia Maternal Grandmother   . Hypertension Maternal Grandmother   . Breast cancer Paternal Grandmother   . Heart disease Paternal Grandmother   . Breast cancer Maternal Aunt   . Colon cancer Neg Hx   . Ovarian cancer Neg Hx   . Diabetes Neg Hx     The following portions of the patient's history were reviewed and updated as appropriate: allergies, current medications, past family history, past medical history, past social history, past surgical history and problem list.  Review of Systems ROS Review of Systems - General ROS: negative for - chills, fatigue, fever, hot flashes, night sweats, weight gain or weight loss Psychological ROS: negative for - anxiety, decreased libido, depression, mood swings, physical abuse or sexual abuse Ophthalmic ROS: negative for - blurry vision, eye pain or loss of vision ENT ROS: negative for - headaches, hearing change, visual changes or vocal changes Allergy and Immunology ROS: negative for - hives, itchy/watery eyes or seasonal allergies Hematological and Lymphatic ROS: negative for - bleeding  problems, bruising, swollen lymph nodes or weight loss Endocrine ROS: negative for - galactorrhea, hair pattern changes, hot flashes, malaise/lethargy, mood swings, palpitations, polydipsia/polyuria, skin changes, temperature intolerance or unexpected weight changes Breast ROS: negative for - new or changing breast lumps or nipple discharge Respiratory ROS: negative for - cough or shortness of breath Cardiovascular ROS: negative for - chest pain, irregular heartbeat, palpitations or shortness of breath Gastrointestinal ROS: no abdominal pain, change in bowel habits, or black or bloody stools Genito-Urinary ROS: no dysuria, trouble voiding, or hematuria Musculoskeletal ROS: negative for - joint pain or joint  stiffness Neurological ROS: negative for - bowel and bladder control changes Dermatological ROS: negative for rash and skin lesion changes   Objective:   BP 140/78   Pulse (!) 105   Ht 5\' 3"  (1.6 m)   Wt 151 lb 1.6 oz (68.5 kg)   LMP 04/03/2016 (Exact Date)   BMI 26.77 kg/m  CONSTITUTIONAL: Well-developed, well-nourished female in no acute distress.  PSYCHIATRIC: Normal mood and affect. Normal behavior. Normal judgment and thought content. NEUROLGIC: Alert and oriented to person, place, and time. Normal muscle tone coordination. No cranial nerve deficit noted. HENT:  Normocephalic, atraumatic, External right and left ear normal. EYES: Conjunctivae and EOM are normal. No scleral icterus.  NECK: Normal range of motion, supple, no masses.  Normal thyroid.  SKIN: Skin is warm and dry. No rash noted. Not diaphoretic. No erythema. No pallor. CARDIOVASCULAR: Normal heart rate noted, regular rhythm, no murmur. RESPIRATORY: Clear to auscultation bilaterally. Effort and breath sounds normal, no problems with respiration noted. BREASTS: Symmetric in size. No masses, skin changes, nipple drainage, or lymphadenopathy. ABDOMEN: Soft, normal bowel sounds, no distention noted.  No tenderness, rebound or guarding.  BLADDER: Normal PELVIC:  External Genitalia: Normal  BUS: Normal  Vagina: Menstrual blood in vault.   Cervix: Normal, no cervical motion tenderness; blood at os; no lesions  Uterus: Normal; anteverted and mobile  Adnexa: Normal   RV: Normal external exam LYMPHATIC: No Axillary, Supraclavicular, or Inguinal Adenopathy.    Assessment:   Annual gynecologic examination 30 y.o. Contraception: none BMI-26  Attempting conception off and on for the past year- on prenatal vitamins. Currently menstruating.  Problem List Items Addressed This Visit    None    Visit Diagnoses    Well woman exam with routine gynecological exam    -  Primary      Plan:  Pap: Deferred till next visit  due to menses. Mammogram: Not Indicated Stool Guaiac Testing:  Not Indicated Labs: Lipid 1, FBS, TSH, Hemoglobin A1C and Vit D Level"". Routine preventative health maintenance measures emphasized: Exercise/Diet/Weight control, Tobacco Warnings and Alcohol/Substance use risks  Continue with prenatal vitamins daily Return to Clinic - 1 Year   Corena HerterAnna Parr, PA-S Darol Destinerystal Miller, CMA  Herold HarmsMartin A Reizy Dunlow, MD   I have seen, interviewed, and examined the patient in conjunction with the [employer] [facility] and affirm the diagnosis and management plan. Winfred Redel A. Chellsea Beckers, MD, FACOG   Note: This dictation was prepared with Dragon dictation along with smaller phrase technology. Any transcriptional errors that result from this process are unintentional.

## 2016-08-10 ENCOUNTER — Encounter: Payer: Self-pay | Admitting: Obstetrics and Gynecology

## 2016-08-10 ENCOUNTER — Ambulatory Visit (INDEPENDENT_AMBULATORY_CARE_PROVIDER_SITE_OTHER): Payer: 59 | Admitting: Obstetrics and Gynecology

## 2016-08-10 VITALS — BP 122/79 | HR 121 | Ht 63.0 in | Wt 153.9 lb

## 2016-08-10 DIAGNOSIS — N912 Amenorrhea, unspecified: Secondary | ICD-10-CM

## 2016-08-10 DIAGNOSIS — Z3201 Encounter for pregnancy test, result positive: Secondary | ICD-10-CM | POA: Diagnosis not present

## 2016-08-10 DIAGNOSIS — Z8669 Personal history of other diseases of the nervous system and sense organs: Secondary | ICD-10-CM

## 2016-08-10 LAB — POCT URINE PREGNANCY: PREG TEST UR: POSITIVE — AB

## 2016-08-10 NOTE — Progress Notes (Signed)
GYN ENCOUNTER NOTE  Subjective:       Caitlyn Blackburn is a 31 y.o. G73P1001 female is here for gynecologic evaluation of the following issues:  1. Pregnancy confirmation  LMP 06/25/2016 (shore) EDD 04/01/2017 EGA 6.4 weeks  Patient presents for pregnancy confirmation. She has mild breast tenderness only. No nausea vomiting vaginal discharge or vaginal bleeding. She has been taking prenatal vitamins since prior to pregnancy. Patient has history of migraine headaches; no headache in years. Last pregnancy was uncomplicated with the exception of some slightly elevated blood pressures in the last week pregnancy. No diagnosis of preeclampsia. Son is now 48 years old," Evaristo Bury".  Prenatal risk factors: 1. History of migraine headaches, none in multiple years 2. History of PIH in the last week in pregnancy, without a diagnosis of preeclampsia      Gynecologic History Patient's last menstrual period was 06/25/2016 (exact date). Contraception: none Last Pap: 01/15/2015 negative Last mammogram: N/A  Obstetric History OB History  Gravida Para Term Preterm AB Living  2 1 1     1   SAB TAB Ectopic Multiple Live Births          1    # Outcome Date GA Lbr Len/2nd Weight Sex Delivery Anes PTL Lv  2 Current           1 Term 2013   7 lb 1.6 oz (3.221 kg) M Vag-Spont   LIV      Past Medical History:  Diagnosis Date  . Abnormal Pap smear of vagina    10 years ago  . History of migraine     Past Surgical History:  Procedure Laterality Date  . WISDOM TOOTH EXTRACTION      Current Outpatient Prescriptions on File Prior to Visit  Medication Sig Dispense Refill  . Prenatal Vit-Fe Fumarate-FA (PRENATAL MULTIVITAMIN) TABS tablet Take 1 tablet by mouth daily.    . SUMAtriptan (IMITREX) 100 MG tablet Take 1 tablet (100 mg total) by mouth every 2 (two) hours as needed for migraine. May repeat in 2 hours if headache persists or recurs. 10 tablet 3   No current facility-administered medications on  file prior to visit.     No Known Allergies  Social History   Social History  . Marital status: Married    Spouse name: N/A  . Number of children: N/A  . Years of education: N/A   Occupational History  . Not on file.   Social History Main Topics  . Smoking status: Never Smoker  . Smokeless tobacco: Never Used  . Alcohol use Yes     Comment: occ  . Drug use: No  . Sexual activity: Yes    Birth control/ protection: None   Other Topics Concern  . Not on file   Social History Narrative  . No narrative on file    Family History  Problem Relation Age of Onset  . Alcohol abuse Maternal Grandfather   . Prostate cancer Maternal Grandfather   . Arthritis Maternal Grandmother   . Hyperlipidemia Maternal Grandmother   . Hypertension Maternal Grandmother   . Breast cancer Paternal Grandmother   . Heart disease Paternal Grandmother   . Breast cancer Maternal Aunt   . Colon cancer Neg Hx   . Ovarian cancer Neg Hx   . Diabetes Neg Hx     The following portions of the patient's history were reviewed and updated as appropriate: allergies, current medications, past family history, past medical history, past social history, past surgical  history and problem list.  Review of Systems Review of Systems  Constitutional: Negative.   HENT: Negative.   Respiratory: Negative.   Cardiovascular: Negative.   Gastrointestinal: Negative for abdominal pain, constipation, nausea and vomiting.  Genitourinary: Negative.        No vaginal bleeding No vaginal discharge  Musculoskeletal: Negative.   Skin: Negative.   Endo/Heme/Allergies: Negative.   Psychiatric/Behavioral: Negative.     Objective:   BP 122/79   Pulse (!) 121   Ht 5\' 3"  (1.6 m)   Wt 153 lb 14.4 oz (69.8 kg)   LMP 06/25/2016 (Exact Date)   BMI 27.26 kg/m  Physical exam deferred   Assessment:   1. Amenorrhea - POCT urine pregnancy  2. Positive urine pregnancy test  3. History of migraine headaches      Plan:   1. UPT-positive 2. Continue prenatal vitamins 3. New OB counseling: The patient has been given an overview regarding routine prenatal care. Recommendations regarding diet, weight gain, and exercise in pregnancy were given. Prenatal testing, optional genetic testing, and ultrasound use in pregnancy were reviewed.  Benefits of Breast Feeding were discussed. The patient is encouraged to consider nursing her baby post partum. The patient desires to enroll in the physician (M.D.) tract.  A total of 25 minutes were spent face-to-face with the patient during this encounter and over half of that time involved counseling and coordination of care.  Herold HarmsMartin A Defrancesco, MD  Note: This dictation was prepared with Dragon dictation along with smaller phrase technology. Any transcriptional errors that result from this process are unintentional.

## 2016-08-10 NOTE — Patient Instructions (Signed)
1. Urine pregnancy test is positive 2. Return in 3 weeks for new OB nursing intake visit 3. Return in 5 weeks for new OB history and physical 4. Prenatal vitamins daily to be continued

## 2016-08-14 ENCOUNTER — Other Ambulatory Visit: Payer: Self-pay | Admitting: Obstetrics and Gynecology

## 2016-08-14 ENCOUNTER — Encounter: Payer: Self-pay | Admitting: Obstetrics and Gynecology

## 2016-08-14 DIAGNOSIS — Z369 Encounter for antenatal screening, unspecified: Secondary | ICD-10-CM

## 2016-08-15 ENCOUNTER — Ambulatory Visit (INDEPENDENT_AMBULATORY_CARE_PROVIDER_SITE_OTHER): Payer: 59

## 2016-08-15 ENCOUNTER — Ambulatory Visit (INDEPENDENT_AMBULATORY_CARE_PROVIDER_SITE_OTHER): Payer: 59 | Admitting: Obstetrics and Gynecology

## 2016-08-15 ENCOUNTER — Encounter: Payer: Self-pay | Admitting: Obstetrics and Gynecology

## 2016-08-15 VITALS — BP 156/108 | HR 137 | Ht 63.0 in | Wt 149.0 lb

## 2016-08-15 DIAGNOSIS — O200 Threatened abortion: Secondary | ICD-10-CM

## 2016-08-15 DIAGNOSIS — Z369 Encounter for antenatal screening, unspecified: Secondary | ICD-10-CM

## 2016-08-15 NOTE — Patient Instructions (Signed)
1. Quantitative hCG today and in 48 hours 2. Pelvic ultrasound today 3. Miscarriage precautions given

## 2016-08-15 NOTE — Progress Notes (Addendum)
GYN ENCOUNTER NOTE  Subjective:       Caitlyn Blackburn is a 31 y.o. G14P1001 female is here for gynecologic evaluation of the following issues:  1. Threatened abortion  Patient at 7+ weeks gestation, presents with vaginal bleeding which started 2 days ago. Bleeding became heavier with cramps and bright red flow. She is uncertain if she passed tissue..     Gynecologic History Patient's last menstrual period was 06/25/2016 (exact date). Contraception: none  Obstetric History OB History  Gravida Para Term Preterm AB Living  2 1 1     1   SAB TAB Ectopic Multiple Live Births          1    # Outcome Date GA Lbr Len/2nd Weight Sex Delivery Anes PTL Lv  2 Current           1 Term 2013   7 lb 1.6 oz (3.221 kg) M Vag-Spont   LIV      Past Medical History:  Diagnosis Date  . Abnormal Pap smear of vagina    10 years ago  . History of migraine     Past Surgical History:  Procedure Laterality Date  . WISDOM TOOTH EXTRACTION      Current Outpatient Prescriptions on File Prior to Visit  Medication Sig Dispense Refill  . Prenatal Vit-Fe Fumarate-FA (PRENATAL MULTIVITAMIN) TABS tablet Take 1 tablet by mouth daily.    . SUMAtriptan (IMITREX) 100 MG tablet Take 1 tablet (100 mg total) by mouth every 2 (two) hours as needed for migraine. May repeat in 2 hours if headache persists or recurs. 10 tablet 3   No current facility-administered medications on file prior to visit.     No Known Allergies  Social History   Social History  . Marital status: Married    Spouse name: N/A  . Number of children: N/A  . Years of education: N/A   Occupational History  . Not on file.   Social History Main Topics  . Smoking status: Never Smoker  . Smokeless tobacco: Never Used  . Alcohol use Yes     Comment: occ  . Drug use: No  . Sexual activity: Yes    Birth control/ protection: None   Other Topics Concern  . Not on file   Social History Narrative  . No narrative on file    Family  History  Problem Relation Age of Onset  . Alcohol abuse Maternal Grandfather   . Prostate cancer Maternal Grandfather   . Arthritis Maternal Grandmother   . Hyperlipidemia Maternal Grandmother   . Hypertension Maternal Grandmother   . Breast cancer Paternal Grandmother   . Heart disease Paternal Grandmother   . Breast cancer Maternal Aunt   . Colon cancer Neg Hx   . Ovarian cancer Neg Hx   . Diabetes Neg Hx     The following portions of the patient's history were reviewed and updated as appropriate: allergies, current medications, past family history, past medical history, past social history, past surgical history and problem list.  Review of Systems Review of Systems - Per history of present illness  Objective:   BP (!) 156/108   Pulse (!) 137   Ht 5\' 3"  (1.6 m)   Wt 149 lb (67.6 kg)   LMP 06/25/2016 (Exact Date)   BMI 26.39 kg/m  CONSTITUTIONAL: Well-developed, well-nourished female in no acute distress. Tearful and anxious HENT:  Normocephalic, atraumatic.  NECK: Not examined SKIN: Skin is warm and dry. No rash noted.  Not diaphoretic. No erythema. No pallor. NEUROLGIC: Alert and oriented to person, place, and time. PSYCHIATRIC: Normal mood and affect. Normal behavior. Normal judgment and thought content. CARDIOVASCULAR:Not Examined RESPIRATORY: Not Examined BREASTS: Not Examined ABDOMEN: Soft, non distended; Non tender.  No Organomegaly. PELVIC:  External Genitalia: Normal  BUS: Normal  Vagina: [place] blood in the vaginal vault; no tissue  Cervix: Normal; closed to ring forceps; minimal bloody mucus at os  Uterus: Anteverted, 6-8 week size, nontender  Adnexa: Normal; nontender  RV: Normal external exam  Bladder: Nontender MUSCULOSKELETAL: Normal range of motion. No tenderness.  No cyanosis, clubbing, or edema.     Assessment:   1. Threatened abortion; Rh+ blood type  - Beta HCG, Quant every 48 hours 2 Pelvic ultrasound today      Plan:   1.  Quantitative hCG today and in 48 hours 2. Pelvic ultrasound today 3. Further management planning will be done after ultrasound 4. Patient has been counseled regarding miscarriage  A total of 15 minutes were spent face-to-face with the patient during this encounter and over half of that time dealt with counseling and coordination of care.   Herold HarmsMartin A Bain Whichard, MD  Note: This dictation was prepared with Dragon dictation along with smaller phrase technology. Any transcriptional errors that result from this process are unintentional.  Addendum: Ultrasound demonstrates 5.[redacted] week gestational sac without fetal pole. This is not consistent with LMP dating. No adnexal masses are identified.  PLAN: 1. Quantitative hCG titers as recommended 2. Follow-up on 3/13 2018 3. Miscarriage precautions are given  Herold HarmsMartin A Arine Foley, MD  Note: This dictation was prepared with Dragon dictation along with smaller phrase technology. Any transcriptional errors that result from this process are unintentional.

## 2016-08-15 NOTE — Addendum Note (Signed)
Addended by: Marchelle FolksMILLER, Murial Beam G on: 08/15/2016 01:43 PM   Modules accepted: Orders

## 2016-08-16 LAB — BETA HCG QUANT (REF LAB): hCG Quant: 368 m[IU]/mL

## 2016-08-17 ENCOUNTER — Other Ambulatory Visit: Payer: 59

## 2016-08-17 ENCOUNTER — Other Ambulatory Visit: Payer: Self-pay

## 2016-08-17 ENCOUNTER — Encounter: Payer: Self-pay | Admitting: Obstetrics and Gynecology

## 2016-08-17 DIAGNOSIS — O200 Threatened abortion: Secondary | ICD-10-CM

## 2016-08-18 ENCOUNTER — Telehealth: Payer: Self-pay

## 2016-08-18 LAB — BETA HCG QUANT (REF LAB): hCG Quant: 54 m[IU]/mL

## 2016-08-18 NOTE — Telephone Encounter (Signed)
-----   Message from Herold HarmsMartin A Defrancesco, MD sent at 08/18/2016 10:05 AM EST ----- Please notify - Abnormal Labs HCG level is declining, consistent with spontaneous miscarriage Keep appointment as scheduled for Tuesday

## 2016-08-18 NOTE — Telephone Encounter (Signed)
Pt returns call, informed her of lab results. Pt to keep Tuesday appt. Pt gave verbal understanding.

## 2016-08-22 ENCOUNTER — Ambulatory Visit (INDEPENDENT_AMBULATORY_CARE_PROVIDER_SITE_OTHER): Payer: 59 | Admitting: Obstetrics and Gynecology

## 2016-08-22 ENCOUNTER — Encounter: Payer: Self-pay | Admitting: Obstetrics and Gynecology

## 2016-08-22 VITALS — BP 139/84 | HR 90 | Ht 63.0 in | Wt 146.7 lb

## 2016-08-22 DIAGNOSIS — O039 Complete or unspecified spontaneous abortion without complication: Secondary | ICD-10-CM

## 2016-08-22 NOTE — Progress Notes (Signed)
Chief complaint: 1. Spontaneous abortion  The patient presents today for follow-up on miscarriage. Over the weekend she reported passing a fragment of tissue possibly consistent with products of conception. She is not expressing any significant pelvic cramping or bleeding at this time.  Review of quantitative hCGs demonstrates a declining hCG titer:  08/15/2016 368  08/17/2016  54  OBJECTIVE: BP 139/84   Pulse 90   Ht 5\' 3"  (1.6 m)   Wt 146 lb 11.2 oz (66.5 kg)   LMP 06/25/2016 (Exact Date)   BMI 25.99 kg/m  Pleasant female in no acute distress. Alert and oriented. Affect appropriate. Physical exam deferred  ASSESSMENT: 1. Spontaneous abortion, probably complete 2. Normal emotional adjustment  PLAN: 1. Weekly quantitative hCG starting today, and repeating until hCG level is negative. 2. Continue taking prenatal vitamins 3. Avoid conception until at least 1 normal cycle after pregnancy test is negative.  A total of 15 minutes were spent face-to-face with the patient during this encounter and over half of that time dealt with counseling and coordination of care.  Herold HarmsMartin A Kaena Santori, MD  Note: This dictation was prepared with Dragon dictation along with smaller phrase technology. Any transcriptional errors that result from this process are unintentional.

## 2016-08-22 NOTE — Patient Instructions (Signed)
1. Weekly quantitative hCG titers starting today until the hCG titer is less than 5 2. Hold off for 1 cycle after hCG test is negative for trying to conceive 3. Continue taking prenatal vitamins

## 2016-08-23 LAB — BETA HCG QUANT (REF LAB): hCG Quant: 7 m[IU]/mL

## 2016-08-24 ENCOUNTER — Other Ambulatory Visit: Payer: Self-pay

## 2016-08-24 DIAGNOSIS — O039 Complete or unspecified spontaneous abortion without complication: Secondary | ICD-10-CM

## 2016-08-24 LAB — PATHOLOGY

## 2016-08-29 ENCOUNTER — Other Ambulatory Visit: Payer: 59

## 2016-08-29 DIAGNOSIS — O039 Complete or unspecified spontaneous abortion without complication: Secondary | ICD-10-CM | POA: Diagnosis not present

## 2016-08-30 LAB — BETA HCG QUANT (REF LAB): hCG Quant: <1 m[IU]/mL

## 2016-09-19 ENCOUNTER — Encounter: Payer: 59 | Admitting: Obstetrics and Gynecology

## 2016-11-17 ENCOUNTER — Encounter: Payer: Self-pay | Admitting: Obstetrics and Gynecology

## 2016-11-29 ENCOUNTER — Ambulatory Visit (INDEPENDENT_AMBULATORY_CARE_PROVIDER_SITE_OTHER): Payer: 59 | Admitting: Obstetrics and Gynecology

## 2016-11-29 ENCOUNTER — Encounter: Payer: Self-pay | Admitting: Obstetrics and Gynecology

## 2016-11-29 VITALS — BP 124/78 | HR 91 | Ht 63.0 in | Wt 152.9 lb

## 2016-11-29 DIAGNOSIS — Z8759 Personal history of other complications of pregnancy, childbirth and the puerperium: Secondary | ICD-10-CM

## 2016-11-29 NOTE — Progress Notes (Signed)
GYN ENCOUNTER NOTE  Subjective:       Caitlyn Blackburn is a 31 y.o. G63P1001 female here for consult for fertility.  She has history of spontaneous abortion in March of 2018 and would like advice for how to improve chances of conceiving next child.    She and her husband conceived their first child after 8 months of consistent attempts.  Similar conception history with the recent spontaneous abortion.  Patient states that her cycles range from 25-33 days and she is using an ovulation tracker to optimize timing of intercourse.  She denies any risk factors for infertility including tobacco use, alcohol consumption.    Gynecologic History Patient's last menstrual period was 11/11/2016 (approximate). Contraception: none Last Pap: 02/04/15. Results were: normal   Obstetric History OB History  Gravida Para Term Preterm AB Living  2 1 1     1   SAB TAB Ectopic Multiple Live Births          1    # Outcome Date GA Lbr Len/2nd Weight Sex Delivery Anes PTL Lv  2 Current           1 Term 2013   7 lb 1.6 oz (3.221 kg) M Vag-Spont   LIV      Past Medical History:  Diagnosis Date  . Abnormal Pap smear of vagina    10 years ago  . History of migraine     Past Surgical History:  Procedure Laterality Date  . WISDOM TOOTH EXTRACTION      Current Outpatient Prescriptions on File Prior to Visit  Medication Sig Dispense Refill  . Prenatal Vit-Fe Fumarate-FA (PRENATAL MULTIVITAMIN) TABS tablet Take 1 tablet by mouth daily.     No current facility-administered medications on file prior to visit.     No Known Allergies  Social History   Social History  . Marital status: Married    Spouse name: N/A  . Number of children: N/A  . Years of education: N/A   Occupational History  . Not on file.   Social History Main Topics  . Smoking status: Never Smoker  . Smokeless tobacco: Never Used  . Alcohol use Yes     Comment: occ  . Drug use: No  . Sexual activity: Yes    Birth control/  protection: None   Other Topics Concern  . Not on file   Social History Narrative  . No narrative on file    Family History  Problem Relation Age of Onset  . Alcohol abuse Maternal Grandfather   . Prostate cancer Maternal Grandfather   . Arthritis Maternal Grandmother   . Hyperlipidemia Maternal Grandmother   . Hypertension Maternal Grandmother   . Breast cancer Paternal Grandmother   . Heart disease Paternal Grandmother   . Breast cancer Maternal Aunt   . Colon cancer Neg Hx   . Ovarian cancer Neg Hx   . Diabetes Neg Hx     The following portions of the patient's history were reviewed and updated as appropriate: allergies, current medications, past family history, past medical history, past social history, past surgical history and problem list.   Objective:   BP 124/78   Pulse 91   Ht 5\' 3"  (1.6 m)   Wt 152 lb 14.4 oz (69.4 kg)   LMP 11/11/2016 (Approximate)   BMI 27.09 kg/m  PE-deferred   Assessment:  Preconception Counseling Ovulatory Cycles range from 25-33 days No risk factors for infertility in either parent. Normal conception with previous attempts (  8 months of trying)  Plan:   Healthy lifestyle encouraged for both parents. Continue prenatal vitamins Continue ovulation monitoring Have intercourse once daily around ovulation  If no conception within 12 months will re-evaluate  Follow up at annual exam Oct 2018  A total of 15 minutes were spent face-to-face with the patient during this encounter and over half of that time dealt with counseling and coordination of care.    I have seen, interviewed, and examined the patient in conjunction with the [employer] and affirm the diagnosis and management plan. Caitlyn Macrae A. Evadene Wardrip, MD, FACOG   Caitlyn MelterKristen Moore, PA-S [employer] [facility] [employer] Herold HarmsMartin A Keeshawn Fakhouri, MD   Note: This dictation was prepared with Dragon dictation along with smaller phrase technology. Any transcriptional errors that result  from this process are unintentional.

## 2016-11-29 NOTE — Patient Instructions (Signed)
1. Continue with healthy eating and exercise 2. Continue with prenatal vitamins 3. Continue monitoring intervals with menstrual calendar 4. Continue timed intercourse right around ovulation 5. If no conception within 12 months return for infertility evaluation

## 2016-11-30 ENCOUNTER — Encounter: Payer: 59 | Admitting: Obstetrics and Gynecology

## 2017-04-05 ENCOUNTER — Encounter: Payer: 59 | Admitting: Obstetrics and Gynecology

## 2017-04-05 NOTE — Progress Notes (Signed)
ANNUAL PREVENTATIVE CARE GYN  ENCOUNTER NOTE  Subjective:       Caitlyn Blackburn is a 31 y.o. 231P1001 female here for a routine annual gynecologic exam.  Current complaints: 1.  NONE  2. Attempting conception off and on for the past year, on prenatal vitamins.  Bowels and bladder functioning normally. No major interval health issues   Gynecologic History Patient's last menstrual period was 11/11/2016 (approximate). Contraception: none Last Pap: 01/2015 NEG. Results were: normal Last mammogram: N/A.   Obstetric History OB History  Gravida Para Term Preterm AB Living  2 1 1     1   SAB TAB Ectopic Multiple Live Births          1    # Outcome Date GA Lbr Caitlyn/2nd Weight Sex Delivery Anes PTL Lv  2 Current           1 Term 2013   7 lb 1.6 oz (3.221 kg) M Vag-Spont   LIV      Past Medical History:  Diagnosis Date  . Abnormal Pap smear of vagina    10 years ago  . History of migraine     Past Surgical History:  Procedure Laterality Date  . WISDOM TOOTH EXTRACTION      Current Outpatient Prescriptions on File Prior to Visit  Medication Sig Dispense Refill  . Prenatal Vit-Fe Fumarate-FA (PRENATAL MULTIVITAMIN) TABS tablet Take 1 tablet by mouth daily.     No current facility-administered medications on file prior to visit.     No Known Allergies  Social History   Social History  . Marital status: Married    Spouse name: N/A  . Number of children: N/A  . Years of education: N/A   Occupational History  . Not on file.   Social History Main Topics  . Smoking status: Never Smoker  . Smokeless tobacco: Never Used  . Alcohol use Yes     Comment: occ  . Drug use: No  . Sexual activity: Yes    Birth control/ protection: None   Other Topics Concern  . Not on file   Social History Narrative  . No narrative on file    Family History  Problem Relation Age of Onset  . Alcohol abuse Maternal Grandfather   . Prostate cancer Maternal Grandfather   . Arthritis  Maternal Grandmother   . Hyperlipidemia Maternal Grandmother   . Hypertension Maternal Grandmother   . Breast cancer Paternal Grandmother   . Heart disease Paternal Grandmother   . Breast cancer Maternal Aunt   . Colon cancer Neg Hx   . Ovarian cancer Neg Hx   . Diabetes Neg Hx     The following portions of the patient's history were reviewed and updated as appropriate: allergies, current medications, past family history, past medical history, past social history, past surgical history and problem list.  Review of Systems Review of Systems  Genitourinary:       Menstrual cycles 30-33-day intervals  All other systems reviewed and are negative.    Objective:   BP (!) 144/86   Pulse 99   Ht 5\' 3"  (1.6 m)   Wt 153 lb (69.4 kg)   LMP 03/11/2017 (Exact Date)   BMI 27.10 kg/m  CONSTITUTIONAL: Well-developed, well-nourished female in no acute distress.  PSYCHIATRIC: Normal mood and affect. Normal behavior. Normal judgment and thought content. NEUROLGIC: Alert and oriented to person, place, and time. Normal muscle tone coordination. No cranial nerve deficit noted. HENT:  Normocephalic, atraumatic,  External right and left ear normal. EYES: Conjunctivae and EOM are normal. No scleral icterus.  NECK: Normal range of motion, supple, no masses.  Normal thyroid.  SKIN: Skin is warm and dry. No rash noted. Not diaphoretic. No erythema. No pallor. CARDIOVASCULAR: Normal heart rate noted, regular rhythm, 2/6 systolic ejection murmur left sternal border RESPIRATORY: Clear to auscultation bilaterally. Effort and breath sounds normal, no problems with respiration noted. BREASTS: Symmetric in size. No masses, skin changes, nipple drainage, or lymphadenopathy. ABDOMEN: Soft, normal bowel sounds, no distention noted.  No tenderness, rebound or guarding.  BLADDER: Normal PELVIC:  External Genitalia: Normal  BUS: Normal  Vagina: Menstrual blood in vault.   Cervix: Normal, no cervical motion  tenderness; blood at os; no lesions  Uterus: Normal; anteverted and mobile, nontender  Adnexa: Normal; nonpalpable and nontender  RV: Normal external exam LYMPHATIC: No Axillary, Supraclavicular, or Inguinal Adenopathy.    Assessment:   Annual gynecologic examination 31 y.o. Contraception: none bmi-27 Attempting conception off and on for the past year- on prenatal vitamins.  Problem List Items Addressed This Visit    None    Visit Diagnoses    Well woman exam with routine gynecological exam    -  Primary      Plan:  Pap:pap w/hpv  Mammogram: Not Indicated Stool Guaiac Testing:  Not Indicated Labs: Lipid 1, FBS, TSH and Hemoglobin A1C Routine preventative health maintenance measures emphasized: Exercise/Diet/Weight control, Tobacco Warnings and Alcohol/Substance use risks  Continue with prenatal vitamins daily Return to Clinic - 1 Year   Caitlyn Blackburn, CMA  Herold Harms, MD  Note: This dictation was prepared with Dragon dictation along with smaller phrase technology. Any transcriptional errors that result from this process are unintentional.

## 2017-04-10 ENCOUNTER — Ambulatory Visit (INDEPENDENT_AMBULATORY_CARE_PROVIDER_SITE_OTHER): Payer: 59 | Admitting: Obstetrics and Gynecology

## 2017-04-10 ENCOUNTER — Encounter: Payer: Self-pay | Admitting: Obstetrics and Gynecology

## 2017-04-10 VITALS — BP 147/71 | HR 72 | Ht 63.0 in | Wt 153.0 lb

## 2017-04-10 DIAGNOSIS — Z01419 Encounter for gynecological examination (general) (routine) without abnormal findings: Secondary | ICD-10-CM | POA: Diagnosis not present

## 2017-04-10 NOTE — Patient Instructions (Signed)
1.  Pap smear is done 2.  Self breast awareness is encouraged 3.  Screening labs are ordered 4.  Continue with healthy eating and exercise 5.  Continue with prenatal vitamins daily 6.  Maintain menstrual calendar monitoring for assessment of menstrual cycles while trying to conceive 7.  Return in 6 months for follow-up if conception is not obtained. 8.  Return in 1 year for annual exam.

## 2017-04-11 LAB — LIPID PANEL
Chol/HDL Ratio: 2.4 ratio (ref 0.0–4.4)
Cholesterol, Total: 155 mg/dL (ref 100–199)
HDL: 65 mg/dL (ref >39)
LDL Calculated: 80 mg/dL (ref 0–99)
Triglycerides: 49 mg/dL (ref 0–149)
VLDL Cholesterol Cal: 10 mg/dL (ref 5–40)

## 2017-04-11 LAB — GLUCOSE, RANDOM: Glucose: 104 mg/dL — ABNORMAL HIGH (ref 65–99)

## 2017-04-11 LAB — HEMOGLOBIN A1C
ESTIMATED AVERAGE GLUCOSE: 91 mg/dL
HEMOGLOBIN A1C: 4.8 % (ref 4.8–5.6)

## 2017-04-11 LAB — TSH: TSH: 2.25 u[IU]/mL (ref 0.450–4.500)

## 2017-04-13 LAB — IGP, COBASHPV16/18
HPV 16: NEGATIVE
HPV 18: NEGATIVE
HPV other hr types: NEGATIVE
PAP SMEAR COMMENT: 0

## 2017-10-09 ENCOUNTER — Encounter: Payer: 59 | Admitting: Obstetrics and Gynecology

## 2018-01-02 ENCOUNTER — Encounter: Payer: Self-pay | Admitting: Obstetrics and Gynecology

## 2018-04-04 NOTE — Progress Notes (Signed)
ANNUAL PREVENTATIVE CARE GYN  ENCOUNTER NOTE  Subjective:       Caitlyn Blackburn is a 32 y.o. G60P1011 female here for a routine annual gynecologic exam.  Current complaints: 1. none   Bowels and bladder functioning normally. No major interval health issues   Gynecologic History LMP-03/26/2018 Contraception: none Last Pap: 04/10/2017  NEG/NEG. Results were: normal Last mammogram: N/A.   Obstetric History OB History  Gravida Para Term Preterm AB Living  2 1 1     1   SAB TAB Ectopic Multiple Live Births          1    # Outcome Date GA Lbr Len/2nd Weight Sex Delivery Anes PTL Lv  2 Current           1 Term 2013   7 lb 1.6 oz (3.221 kg) M Vag-Spont   LIV    Past Medical History:  Diagnosis Date  . Abnormal Pap smear of vagina    10 years ago  . History of migraine     Past Surgical History:  Procedure Laterality Date  . WISDOM TOOTH EXTRACTION      Current Outpatient Medications on File Prior to Visit  Medication Sig Dispense Refill  . Prenatal Vit-Fe Fumarate-FA (PRENATAL MULTIVITAMIN) TABS tablet Take 1 tablet by mouth daily.     No current facility-administered medications on file prior to visit.     No Known Allergies  Social History   Socioeconomic History  . Marital status: Married    Spouse name: Not on file  . Number of children: Not on file  . Years of education: Not on file  . Highest education level: Not on file  Occupational History  . Not on file  Social Needs  . Financial resource strain: Not on file  . Food insecurity:    Worry: Not on file    Inability: Not on file  . Transportation needs:    Medical: Not on file    Non-medical: Not on file  Tobacco Use  . Smoking status: Never Smoker  . Smokeless tobacco: Never Used  Substance and Sexual Activity  . Alcohol use: Yes    Comment: occ  . Drug use: No  . Sexual activity: Yes    Birth control/protection: None  Lifestyle  . Physical activity:    Days per week: Not on file    Minutes  per session: Not on file  . Stress: Not on file  Relationships  . Social connections:    Talks on phone: Not on file    Gets together: Not on file    Attends religious service: Not on file    Active member of club or organization: Not on file    Attends meetings of clubs or organizations: Not on file    Relationship status: Not on file  . Intimate partner violence:    Fear of current or ex partner: Not on file    Emotionally abused: Not on file    Physically abused: Not on file    Forced sexual activity: Not on file  Other Topics Concern  . Not on file  Social History Narrative  . Not on file    Family History  Problem Relation Age of Onset  . Alcohol abuse Maternal Grandfather   . Prostate cancer Maternal Grandfather   . Arthritis Maternal Grandmother   . Hyperlipidemia Maternal Grandmother   . Hypertension Maternal Grandmother   . Breast cancer Paternal Grandmother   . Heart disease Paternal  Grandmother   . Breast cancer Maternal Aunt   . Colon cancer Neg Hx   . Ovarian cancer Neg Hx   . Diabetes Neg Hx     The following portions of the patient's history were reviewed and updated as appropriate: allergies, current medications, past family history, past medical history, past social history, past surgical history and problem list.  Review of Systems Review of Systems  Respiratory: Negative for cough and shortness of breath.   Cardiovascular: Negative for chest pain and palpitations.  Gastrointestinal: Negative for abdominal pain, constipation, diarrhea, nausea and vomiting.  Genitourinary: Negative for dysuria, frequency and urgency.  All other systems reviewed and are negative.      Objective:   BP 128/80   Pulse 90   Ht 5\' 3"  (1.6 m)   Wt 144 lb 11.2 oz (65.6 kg)   LMP 03/26/2018   Breastfeeding? No   BMI 25.63 kg/m  CONSTITUTIONAL: Well-developed, well-nourished female in no acute distress.  PSYCHIATRIC: Normal mood and affect. Normal behavior. Normal  judgment and thought content. NEUROLGIC: Alert and oriented to person, place, and time. Normal muscle tone coordination. No cranial nerve deficit noted. HENT:  Normocephalic, atraumatic, External right and left ear normal. EYES: Conjunctivae and EOM are normal. No scleral icterus.  NECK: Normal range of motion, supple, no masses.  Normal thyroid.  SKIN: Skin is warm and dry. No rash noted. Not diaphoretic. No erythema. No pallor. CARDIOVASCULAR: Normal heart rate noted, regular rhythm, 2/6 systolic ejection murmur left sternal border (unchanged) RESPIRATORY: Clear to auscultation bilaterally. Effort and breath sounds normal, no problems with respiration noted. BREASTS: Symmetric in size. No masses, skin changes, nipple drainage, or lymphadenopathy. ABDOMEN: Soft, normal bowel sounds, no distention noted.  No tenderness, rebound or guarding.  BLADDER: Normal; nontender PELVIC:  External Genitalia: Normal  BUS: Normal  Vagina: Good estrogen effect; no discharge  Cervix: Eversion present; no cervical motion tenderness; blood at os; no lesions  Uterus: Normal; anteverted and mobile, nontender, nonenlarged  Adnexa: Normal; nonpalpable and nontender  RV: Normal external exam LYMPHATIC: No Axillary, Supraclavicular, or Inguinal Adenopathy.    Assessment:   Annual gynecologic examination 32 y.o. Contraception: none bmi-25 Attempting conception off and on for the past year- on prenatal vitamins.  Problem List Items Addressed This Visit    History of miscarriage    Other Visit Diagnoses    Well woman exam with routine gynecological exam    -  Primary     Currently becoming foster parents, excited.  Still open to conception. Plan:  Pap:Due 2021 Mammogram: Not Indicated Stool Guaiac Testing:  Not Indicated Labs: Lipid 1, FBS, TSH and Hemoglobin A1C Routine preventative health maintenance measures emphasized: Exercise/Diet/Weight control, Tobacco Warnings and Alcohol/Substance use risks   Continue with prenatal vitamins daily Return to Clinic - 1 Year-schedule with either Dr. Logan Bores or Dr. Valentino Saxon depending on their schedules   Darol Destine, CMA  Herold Harms, MD   Note: This dictation was prepared with Dragon dictation along with smaller phrase technology. Any transcriptional errors that result from this process are unintentional.

## 2018-04-11 ENCOUNTER — Encounter: Payer: 59 | Admitting: Obstetrics and Gynecology

## 2018-04-16 ENCOUNTER — Encounter: Payer: Self-pay | Admitting: Obstetrics and Gynecology

## 2018-04-16 ENCOUNTER — Ambulatory Visit (INDEPENDENT_AMBULATORY_CARE_PROVIDER_SITE_OTHER): Payer: 59 | Admitting: Obstetrics and Gynecology

## 2018-04-16 VITALS — BP 128/80 | HR 90 | Ht 63.0 in | Wt 144.7 lb

## 2018-04-16 DIAGNOSIS — Z8759 Personal history of other complications of pregnancy, childbirth and the puerperium: Secondary | ICD-10-CM | POA: Diagnosis not present

## 2018-04-16 DIAGNOSIS — Z01419 Encounter for gynecological examination (general) (routine) without abnormal findings: Secondary | ICD-10-CM | POA: Diagnosis not present

## 2018-04-16 NOTE — Patient Instructions (Addendum)
1.  Pap smear is not done.  Next Pap smear is due 2021. 2.  Self breast awareness is encouraged. 3.  Continue with healthy eating and exercise. 4.  Continue with prenatal vitamin daily. 5.  Screening labs are obtained today. 6.  Return in 1 year for annual exam.  Health Maintenance, Female Adopting a healthy lifestyle and getting preventive care can go a long way to promote health and wellness. Talk with your health care provider about what schedule of regular examinations is right for you. This is a good chance for you to check in with your provider about disease prevention and staying healthy. In between checkups, there are plenty of things you can do on your own. Experts have done a lot of research about which lifestyle changes and preventive measures are most likely to keep you healthy. Ask your health care provider for more information. Weight and diet Eat a healthy diet  Be sure to include plenty of vegetables, fruits, low-fat dairy products, and lean protein.  Do not eat a lot of foods high in solid fats, added sugars, or salt.  Get regular exercise. This is one of the most important things you can do for your health. ? Most adults should exercise for at least 150 minutes each week. The exercise should increase your heart rate and make you sweat (moderate-intensity exercise). ? Most adults should also do strengthening exercises at least twice a week. This is in addition to the moderate-intensity exercise.  Maintain a healthy weight  Body mass index (BMI) is a measurement that can be used to identify possible weight problems. It estimates body fat based on height and weight. Your health care provider can help determine your BMI and help you achieve or maintain a healthy weight.  For females 38 years of age and older: ? A BMI below 18.5 is considered underweight. ? A BMI of 18.5 to 24.9 is normal. ? A BMI of 25 to 29.9 is considered overweight. ? A BMI of 30 and above is considered  obese.  Watch levels of cholesterol and blood lipids  You should start having your blood tested for lipids and cholesterol at 32 years of age, then have this test every 5 years.  You may need to have your cholesterol levels checked more often if: ? Your lipid or cholesterol levels are high. ? You are older than 32 years of age. ? You are at high risk for heart disease.  Cancer screening Lung Cancer  Lung cancer screening is recommended for adults 60-43 years old who are at high risk for lung cancer because of a history of smoking.  A yearly low-dose CT scan of the lungs is recommended for people who: ? Currently smoke. ? Have quit within the past 15 years. ? Have at least a 30-pack-year history of smoking. A pack year is smoking an average of one pack of cigarettes a day for 1 year.  Yearly screening should continue until it has been 15 years since you quit.  Yearly screening should stop if you develop a health problem that would prevent you from having lung cancer treatment.  Breast Cancer  Practice breast self-awareness. This means understanding how your breasts normally appear and feel.  It also means doing regular breast self-exams. Let your health care provider know about any changes, no matter how small.  If you are in your 20s or 30s, you should have a clinical breast exam (CBE) by a health care provider every 1-3 years as  part of a regular health exam.  If you are 40 or older, have a CBE every year. Also consider having a breast X-ray (mammogram) every year.  If you have a family history of breast cancer, talk to your health care provider about genetic screening.  If you are at high risk for breast cancer, talk to your health care provider about having an MRI and a mammogram every year.  Breast cancer gene (BRCA) assessment is recommended for women who have family members with BRCA-related cancers. BRCA-related cancers  include: ? Breast. ? Ovarian. ? Tubal. ? Peritoneal cancers.  Results of the assessment will determine the need for genetic counseling and BRCA1 and BRCA2 testing.  Cervical Cancer Your health care provider may recommend that you be screened regularly for cancer of the pelvic organs (ovaries, uterus, and vagina). This screening involves a pelvic examination, including checking for microscopic changes to the surface of your cervix (Pap test). You may be encouraged to have this screening done every 3 years, beginning at age 66.  For women ages 23-65, health care providers may recommend pelvic exams and Pap testing every 3 years, or they may recommend the Pap and pelvic exam, combined with testing for human papilloma virus (HPV), every 5 years. Some types of HPV increase your risk of cervical cancer. Testing for HPV may also be done on women of any age with unclear Pap test results.  Other health care providers may not recommend any screening for nonpregnant women who are considered low risk for pelvic cancer and who do not have symptoms. Ask your health care provider if a screening pelvic exam is right for you.  If you have had past treatment for cervical cancer or a condition that could lead to cancer, you need Pap tests and screening for cancer for at least 20 years after your treatment. If Pap tests have been discontinued, your risk factors (such as having a new sexual partner) need to be reassessed to determine if screening should resume. Some women have medical problems that increase the chance of getting cervical cancer. In these cases, your health care provider may recommend more frequent screening and Pap tests.  Colorectal Cancer  This type of cancer can be detected and often prevented.  Routine colorectal cancer screening usually begins at 32 years of age and continues through 32 years of age.  Your health care provider may recommend screening at an earlier age if you have risk factors  for colon cancer.  Your health care provider may also recommend using home test kits to check for hidden blood in the stool.  A small camera at the end of a tube can be used to examine your colon directly (sigmoidoscopy or colonoscopy). This is done to check for the earliest forms of colorectal cancer.  Routine screening usually begins at age 84.  Direct examination of the colon should be repeated every 5-10 years through 32 years of age. However, you may need to be screened more often if early forms of precancerous polyps or small growths are found.  Skin Cancer  Check your skin from head to toe regularly.  Tell your health care provider about any new moles or changes in moles, especially if there is a change in a mole's shape or color.  Also tell your health care provider if you have a mole that is larger than the size of a pencil eraser.  Always use sunscreen. Apply sunscreen liberally and repeatedly throughout the day.  Protect yourself by wearing  long sleeves, pants, a wide-brimmed hat, and sunglasses whenever you are outside.  Heart disease, diabetes, and high blood pressure  High blood pressure causes heart disease and increases the risk of stroke. High blood pressure is more likely to develop in: ? People who have blood pressure in the high end of the normal range (130-139/85-89 mm Hg). ? People who are overweight or obese. ? People who are African American.  If you are 84-53 years of age, have your blood pressure checked every 3-5 years. If you are 36 years of age or older, have your blood pressure checked every year. You should have your blood pressure measured twice-once when you are at a hospital or clinic, and once when you are not at a hospital or clinic. Record the average of the two measurements. To check your blood pressure when you are not at a hospital or clinic, you can use: ? An automated blood pressure machine at a pharmacy. ? A home blood pressure monitor.  If  you are between 81 years and 46 years old, ask your health care provider if you should take aspirin to prevent strokes.  Have regular diabetes screenings. This involves taking a blood sample to check your fasting blood sugar level. ? If you are at a normal weight and have a low risk for diabetes, have this test once every three years after 32 years of age. ? If you are overweight and have a high risk for diabetes, consider being tested at a younger age or more often. Preventing infection Hepatitis B  If you have a higher risk for hepatitis B, you should be screened for this virus. You are considered at high risk for hepatitis B if: ? You were born in a country where hepatitis B is common. Ask your health care provider which countries are considered high risk. ? Your parents were born in a high-risk country, and you have not been immunized against hepatitis B (hepatitis B vaccine). ? You have HIV or AIDS. ? You use needles to inject street drugs. ? You live with someone who has hepatitis B. ? You have had sex with someone who has hepatitis B. ? You get hemodialysis treatment. ? You take certain medicines for conditions, including cancer, organ transplantation, and autoimmune conditions.  Hepatitis C  Blood testing is recommended for: ? Everyone born from [birth year] through 1965. ? Anyone with known risk factors for hepatitis C.  Sexually transmitted infections (STIs)  You should be screened for sexually transmitted infections (STIs) including gonorrhea and chlamydia if: ? You are sexually active and are younger than 32 years of age. ? You are older than 32 years of age and your health care provider tells you that you are at risk for this type of infection. ? Your sexual activity has changed since you were last screened and you are at an increased risk for chlamydia or gonorrhea. Ask your health care provider if you are at risk.  If you do not have HIV, but are at risk, it may be recommended  that you take a prescription medicine daily to prevent HIV infection. This is called pre-exposure prophylaxis (PrEP). You are considered at risk if: ? You are sexually active and do not regularly use condoms or know the HIV status of your partner(s). ? You take drugs by injection. ? You are sexually active with a partner who has HIV.  Talk with your health care provider about whether you are at high risk of being infected with HIV.  If you choose to begin PrEP, you should first be tested for HIV. You should then be tested every 3 months for as long as you are taking PrEP. Pregnancy  If you are premenopausal and you may become pregnant, ask your health care provider about preconception counseling.  If you may become pregnant, take 400 to 800 micrograms (mcg) of folic acid every day.  If you want to prevent pregnancy, talk to your health care provider about birth control (contraception). Osteoporosis and menopause  Osteoporosis is a disease in which the bones lose minerals and strength with aging. This can result in serious bone fractures. Your risk for osteoporosis can be identified using a bone density scan.  If you are 8 years of age or older, or if you are at risk for osteoporosis and fractures, ask your health care provider if you should be screened.  Ask your health care provider whether you should take a calcium or vitamin D supplement to lower your risk for osteoporosis.  Menopause may have certain physical symptoms and risks.  Hormone replacement therapy may reduce some of these symptoms and risks. Talk to your health care provider about whether hormone replacement therapy is right for you. Follow these instructions at home:  Schedule regular health, dental, and eye exams.  Stay current with your immunizations.  Do not use any tobacco products including cigarettes, chewing tobacco, or electronic cigarettes.  If you are pregnant, do not drink alcohol.  If you are  breastfeeding, limit how much and how often you drink alcohol.  Limit alcohol intake to no more than 1 drink per day for nonpregnant women. One drink equals 12 ounces of beer, 5 ounces of wine, or 1 ounces of hard liquor.  Do not use street drugs.  Do not share needles.  Ask your health care provider for help if you need support or information about quitting drugs.  Tell your health care provider if you often feel depressed.  Tell your health care provider if you have ever been abused or do not feel safe at home. This information is not intended to replace advice given to you by your health care provider. Make sure you discuss any questions you have with your health care provider. Document Released: 12/12/2010 Document Revised: 11/04/2015 Document Reviewed: 03/02/2015 [employer] Interactive Patient Education  [employer].

## 2018-04-17 ENCOUNTER — Other Ambulatory Visit: Payer: 59

## 2018-04-17 DIAGNOSIS — Z01419 Encounter for gynecological examination (general) (routine) without abnormal findings: Secondary | ICD-10-CM | POA: Diagnosis not present

## 2018-04-18 LAB — LIPID PANEL
CHOL/HDL RATIO: 2.2 ratio (ref 0.0–4.4)
Cholesterol, Total: 191 mg/dL (ref 100–199)
HDL: 87 mg/dL (ref >39)
LDL Calculated: 97 mg/dL (ref 0–99)
Triglycerides: 35 mg/dL (ref 0–149)
VLDL Cholesterol Cal: 7 mg/dL (ref 5–40)

## 2018-04-18 LAB — HEMOGLOBIN A1C
Est. average glucose Bld gHb Est-mCnc: 100 mg/dL
HEMOGLOBIN A1C: 5.1 % (ref 4.8–5.6)

## 2018-04-18 LAB — TSH: TSH: 1.46 u[IU]/mL (ref 0.450–4.500)

## 2018-04-18 LAB — GLUCOSE, RANDOM: Glucose: 87 mg/dL (ref 65–99)

## 2019-04-18 ENCOUNTER — Encounter: Payer: 59 | Admitting: Obstetrics and Gynecology

## 2019-04-21 NOTE — Patient Instructions (Addendum)
Health Maintenance, Female Adopting a healthy lifestyle and getting preventive care are important in promoting health and wellness. Ask your health care provider about:  The right schedule for you to have regular tests and exams.  Things you can do on your own to prevent diseases and keep yourself healthy. What should I know about diet, weight, and exercise? Eat a healthy diet   Eat a diet that includes plenty of vegetables, fruits, low-fat dairy products, and lean protein.  Do not eat a lot of foods that are high in solid fats, added sugars, or sodium. Maintain a healthy weight Body mass index (BMI) is used to identify weight problems. It estimates body fat based on height and weight. Your health care provider can help determine your BMI and help you achieve or maintain a healthy weight. Get regular exercise Get regular exercise. This is one of the most important things you can do for your health. Most adults should:  Exercise for at least 150 minutes each week. The exercise should increase your heart rate and make you sweat (moderate-intensity exercise).  Do strengthening exercises at least twice a week. This is in addition to the moderate-intensity exercise.  Spend less time sitting. Even light physical activity can be beneficial. Watch cholesterol and blood lipids Have your blood tested for lipids and cholesterol at 33 years of age, then have this test every 5 years. Have your cholesterol levels checked more often if:  Your lipid or cholesterol levels are high.  You are older than 33 years of age.  You are at high risk for heart disease. What should I know about cancer screening? Depending on your health history and family history, you may need to have cancer screening at various ages. This may include screening for:  Breast cancer.  Cervical cancer.  Colorectal cancer.  Skin cancer.  Lung cancer. What should I know about heart disease, diabetes, and high blood  pressure? Blood pressure and heart disease  High blood pressure causes heart disease and increases the risk of stroke. This is more likely to develop in people who have high blood pressure readings, are of African descent, or are overweight.  Have your blood pressure checked: ? Every 3-5 years if you are 18-39 years of age. ? Every year if you are 40 years old or older. Diabetes Have regular diabetes screenings. This checks your fasting blood sugar level. Have the screening done:  Once every three years after age 40 if you are at a normal weight and have a low risk for diabetes.  More often and at a younger age if you are overweight or have a high risk for diabetes. What should I know about preventing infection? Hepatitis B If you have a higher risk for hepatitis B, you should be screened for this virus. Talk with your health care provider to find out if you are at risk for hepatitis B infection. Hepatitis C Testing is recommended for:  Everyone born from 1945 through 1965.  Anyone with known risk factors for hepatitis C. Sexually transmitted infections (STIs)  Get screened for STIs, including gonorrhea and chlamydia, if: ? You are sexually active and are younger than 33 years of age. ? You are older than 33 years of age and your health care provider tells you that you are at risk for this type of infection. ? Your sexual activity has changed since you were last screened, and you are at increased risk for chlamydia or gonorrhea. Ask your health care provider if   you are at risk.  Ask your health care provider about whether you are at high risk for HIV. Your health care provider may recommend a prescription medicine to help prevent HIV infection. If you choose to take medicine to prevent HIV, you should first get tested for HIV. You should then be tested every 3 months for as long as you are taking the medicine. Pregnancy  If you are about to stop having your period (premenopausal) and  you may become pregnant, seek counseling before you get pregnant.  Take 400 to 800 micrograms (mcg) of folic acid every day if you become pregnant.  Ask for birth control (contraception) if you want to prevent pregnancy. Osteoporosis and menopause Osteoporosis is a disease in which the bones lose minerals and strength with aging. This can result in bone fractures. If you are 36 years old or older, or if you are at risk for osteoporosis and fractures, ask your health care provider if you should:  Be screened for bone loss.  Take a calcium or vitamin D supplement to lower your risk of fractures.  Be given hormone replacement therapy (HRT) to treat symptoms of menopause. Follow these instructions at home: Lifestyle  Do not use any products that contain nicotine or tobacco, such as cigarettes, e-cigarettes, and chewing tobacco. If you need help quitting, ask your health care provider.  Do not use street drugs.  Do not share needles.  Ask your health care provider for help if you need support or information about quitting drugs. Alcohol use  Do not drink alcohol if: ? Your health care provider tells you not to drink. ? You are pregnant, may be pregnant, or are planning to become pregnant.  If you drink alcohol: ? Limit how much you use to 0-1 drink a day. ? Limit intake if you are breastfeeding.  Be aware of how much alcohol is in your drink. In the [place] one drink equals one 12 oz bottle of beer (355 mL), one 5 oz glass of wine (148 mL), or one 1 oz glass of hard liquor (44 mL). General instructions  Schedule regular health, dental, and eye exams.  Stay current with your vaccines.  Tell your health care provider if: ? You often feel depressed. ? You have ever been abused or do not feel safe at home. Summary  Adopting a healthy lifestyle and getting preventive care are important in promoting health and wellness.  Follow your health care provider's instructions about healthy  diet, exercising, and getting tested or screened for diseases.  Follow your health care provider's instructions on monitoring your cholesterol and blood pressure. This information is not intended to replace advice given to you by your health care provider. Make sure you discuss any questions you have with your health care provider. Document Released: 12/12/2010 Document Revised: 05/22/2018 Document Reviewed: 05/22/2018 Elsevier Patient Education  2020 [employer]  Breast Self-Awareness Breast self-awareness is knowing how your breasts look and feel. Doing breast self-awareness is important. It allows you to catch a breast problem early while it is still small and can be treated. All women should do breast self-awareness, including women who have had breast implants. Tell your doctor if you notice a change in your breasts. What you need:  A mirror.  A well-lit room. How to do a breast self-exam A breast self-exam is one way to learn what is normal for your breasts and to check for changes. To do a breast self-exam: Look for changes  1. Take  off all the clothes above your waist. 2. Stand in front of a mirror in a room with good lighting. 3. Put your hands on your hips. 4. Push your hands down. 5. Look at your breasts and nipples in the mirror to see if one breast or nipple looks different from the other. Check to see if: ? The shape of one breast is different. ? The size of one breast is different. ? There are wrinkles, dips, and bumps in one breast and not the other. 6. Look at each breast for changes in the skin, such as: ? Redness. ? Scaly areas. 7. Look for changes in your nipples, such as: ? Liquid around the nipples. ? Bleeding. ? Dimpling. ? Redness. ? A change in where the nipples are. Feel for changes  1. Lie on your back on the floor. 2. Feel each breast. To do this, follow these steps: ? Pick a breast to feel. ? Put the arm closest to that breast above your head.  ? Use your other arm to feel the nipple area of your breast. Feel the area with the pads of your three middle fingers by making small circles with your fingers. For the first circle, press lightly. For the second circle, press harder. For the third circle, press even harder. ? Keep making circles with your fingers at the different pressures as you move down your breast. Stop when you feel your ribs. ? Move your fingers a little toward the center of your body. ? Start making circles with your fingers again, this time going up until you reach your collarbone. ? Keep making up-and-down circles until you reach your armpit. Remember to keep using the three pressures. ? Feel the other breast in the same way. 3. Sit or stand in the tub or shower. 4. With soapy water on your skin, feel each breast the same way you did in step 2 when you were lying on the floor. Write down what you find Writing down what you find can help you remember what to tell your doctor. Write down:  What is normal for each breast.  Any changes you find in each breast, including: ? The kind of changes you find. ? Whether you have pain. ? Size and location of any lumps.  When you last had your menstrual period. General tips  Check your breasts every month.  If you are breastfeeding, the best time to check your breasts is after you feed your baby or after you use a breast pump.  If you get menstrual periods, the best time to check your breasts is 5-7 days after your menstrual period is over.  With time, you will become comfortable with the self-exam, and you will begin to know if there are changes in your breasts. Contact a doctor if you:  See a change in the shape or size of your breasts or nipples.  See a change in the skin of your breast or nipples, such as red or scaly skin.  Have fluid coming from your nipples that is not normal.  Find a lump or thick area that was not there before.  Have pain in your breasts.   Have any concerns about your breast health. Summary  Breast self-awareness includes looking for changes in your breasts, as well as feeling for changes within your breasts.  Breast self-awareness should be done in front of a mirror in a well-lit room.  You should check your breasts every month. If you get menstrual periods,  the best time to check your breasts is 5-7 days after your menstrual period is over.  Let your doctor know of any changes you see in your breasts, including changes in size, changes on the skin, pain or tenderness, or fluid from your nipples that is not normal. This information is not intended to replace advice given to you by your health care provider. Make sure you discuss any questions you have with your health care provider. Document Released: 11/15/2007 Document Revised: 01/15/2018 Document Reviewed: 01/15/2018 Elsevier Patient Education  2020 [employer] Inc.     Metrorrhagia Metrorrhagia is bleeding from the uterus that happens irregularly but often. The bleeding generally happens between menstrual periods. Follow these instructions at home: Pay attention to any changes in your symptoms. Let your health care provider know about them. Follow these instructions to help with your condition: Eating and drinking   Eat well-balanced meals. Include foods that are high in iron, such as liver, meat, shellfish, green leafy vegetables, and eggs.  If you become constipated: ? Drink plenty of water. Drink enough to keep your urine pale yellow. ? Take over-the-counter or prescription medicines. ? Eat foods that are high in fiber, such as beans, whole grains, and fresh fruits and vegetables. ? Limit foods that are high in fat and processed sugars, such as fried or sweet foods. Medicines  Take over-the-counter and prescription medicines only as told by your health care provider.  Do not change medicines without talking with your health care provider.  Aspirin or  medicines that contain aspirin may make the bleeding worse. Do not take these medicines: ? During your period. ? During the week before your period.  If you were prescribed iron pills, take them as told by your health care provider. Iron pills help to replace iron that your body loses because of this condition. Activity  If you need to change your sanitary pad or tampon more than one time every 2 hours: ? Lie in bed with your feet raised (elevated). ? Place a cold pack on your lower abdomen. ? Rest as much as possible until the bleeding stops or slows down. General instructions   For 2 months, write down: ? When your period starts. ? When your period ends. ? When any abnormal bleeding occurs. ? What problems you notice.  Keep all follow-up visits as told by your health care provider. This is important. Contact a health care provider if:  You get light-headed or weak.  You have nausea and vomiting.  You cannot eat or drink without vomiting.  You feel dizzy or have diarrhea while you are taking medicine.  Have questions about birth control. Get help right away if:  You develop a fever or chills.  You need to change your sanitary pad or tampon more than one time per hour.  Your bleeding becomesheavy.  Your flow contains clots.  You develop pain in your abdomen.  You lose consciousness.  You develop a rash. Summary  Metrorrhagia is bleeding from the uterus that happens irregularly but often, usually between menstrual periods.  Pay attention to any changes in your symptoms. Let your health care provider know about them.  Eat well-balanced meals. Include foods that are high in iron, such as liver, meat, shellfish, green leafy vegetables, and eggs.  Get help right away if you develop a fever, you see clots in your blood, your bleeding becomes heavy, you develop a rash, or you lose consciousness. This information is not intended to replace advice given  to you by your  health care provider. Make sure you discuss any questions you have with your health care provider. Document Released: 05/29/2005 Document Revised: 11/29/2017 Document Reviewed: 11/29/2017 Elsevier Patient Education  2020 [employer].

## 2019-04-21 NOTE — Progress Notes (Signed)
Pt is present for annual exam. Pt's last pap 04/10/17 results normal. Pt stated having flu vaccine 02/28/19. Pt stated having some spotting in between cycles not heavy light red.

## 2019-04-22 ENCOUNTER — Other Ambulatory Visit: Payer: Self-pay

## 2019-04-22 ENCOUNTER — Ambulatory Visit (INDEPENDENT_AMBULATORY_CARE_PROVIDER_SITE_OTHER): Payer: 59 | Admitting: Obstetrics and Gynecology

## 2019-04-22 ENCOUNTER — Encounter: Payer: Self-pay | Admitting: Obstetrics and Gynecology

## 2019-04-22 VITALS — BP 126/76 | HR 81 | Ht 63.0 in | Wt 146.1 lb

## 2019-04-22 DIAGNOSIS — Z01419 Encounter for gynecological examination (general) (routine) without abnormal findings: Secondary | ICD-10-CM

## 2019-04-22 DIAGNOSIS — N920 Excessive and frequent menstruation with regular cycle: Secondary | ICD-10-CM

## 2019-04-22 DIAGNOSIS — N923 Ovulation bleeding: Secondary | ICD-10-CM | POA: Diagnosis not present

## 2019-04-22 NOTE — Progress Notes (Signed)
GYNECOLOGY ANNUAL PHYSICAL EXAM PROGRESS NOTE  Subjective:    Caitlyn Blackburn is a 33 y.o. G21P1011 female who presents for an annual exam.  The patient is sexually active.The patient wears seatbelts: yes. The patient participates in regular exercise: yes. Has the patient ever been transfused or tattooed?: no. The patient reports that there is not domestic violence in her life.   The patient has the following complaints today:  1. Patient notes a history of occasional intermittent spotting between cycles for the past 1-1.5 years.  However she notes that over the past 6 months, the spotting has been a little more frequently occurring (reports 3-4 episodes over the past 6 months whereas previously it may be only 1 or 2 episodes in a 6 month period).   Gynecologic History Menarche age: 65 Patient's last menstrual period was 04/05/2019. Contraception: none.  History of STI's: Denies Last Pap: 04/10/2017. Results were: normal.  Reports remote h/o abnormal pap smear x 1.   OB History  Gravida Para Term Preterm AB Living  2 1 1  0 1 1  SAB TAB Ectopic Multiple Live Births  1 0 0 0 1    # Outcome Date GA Lbr Len/2nd Weight Sex Delivery Anes PTL Lv  2 SAB 2018          1 Term 2013   7 lb 1.6 oz (3.221 kg) M Vag-Spont   LIV    Past Medical History:  Diagnosis Date  . Abnormal Pap smear of vagina    10 years ago  . History of migraine     Past Surgical History:  Procedure Laterality Date  . WISDOM TOOTH EXTRACTION      Family History  Problem Relation Age of Onset  . Alcohol abuse Maternal Grandfather   . Prostate cancer Maternal Grandfather   . Arthritis Maternal Grandmother   . Hyperlipidemia Maternal Grandmother   . Hypertension Maternal Grandmother   . Breast cancer Paternal Grandmother   . Heart disease Paternal Grandmother   . Breast cancer Maternal Aunt   . Healthy Mother   . Healthy Father   . Colon cancer Neg Hx   . Ovarian cancer Neg Hx   . Diabetes Neg Hx      Social History   Socioeconomic History  . Marital status: Married    Spouse name: Not on file  . Number of children: Not on file  . Years of education: Not on file  . Highest education level: Not on file  Occupational History  . Not on file  Social Needs  . Financial resource strain: Not on file  . Food insecurity    Worry: Not on file    Inability: Not on file  . Transportation needs    Medical: Not on file    Non-medical: Not on file  Tobacco Use  . Smoking status: Never Smoker  . Smokeless tobacco: Never Used  Substance and Sexual Activity  . Alcohol use: Yes    Comment: occ  . Drug use: No  . Sexual activity: Yes    Birth control/protection: None  Lifestyle  . Physical activity    Days per week: Not on file    Minutes per session: Not on file  . Stress: Not on file  Relationships  . Social 2014 on phone: Not on file    Gets together: Not on file    Attends religious service: Not on file    Active member of club  or organization: Not on file    Attends meetings of clubs or organizations: Not on file    Relationship status: Not on file  . Intimate partner violence    Fear of current or ex partner: Not on file    Emotionally abused: Not on file    Physically abused: Not on file    Forced sexual activity: Not on file  Other Topics Concern  . Not on file  Social History Narrative  . Not on file    Current Outpatient Medications on File Prior to Visit  Medication Sig Dispense Refill  . Prenatal Vit-Fe Fumarate-FA (PRENATAL MULTIVITAMIN) TABS tablet Take 1 tablet by mouth daily.     No current facility-administered medications on file prior to visit.     No Known Allergies   Review of Systems Constitutional: negative for chills, fatigue, fevers and sweats Eyes: negative for irritation, redness and visual disturbance Ears, nose, mouth, throat, and face: negative for hearing loss, nasal congestion, snoring and tinnitus Respiratory:  negative for asthma, cough, sputum Cardiovascular: negative for chest pain, dyspnea, exertional chest pressure/discomfort, irregular heart beat, palpitations and syncope Gastrointestinal: negative for abdominal pain, change in bowel habits, nausea and vomiting Genitourinary: positive for abnormal menstrual period (intermenstrual spotting, see HPI). Genital lesions, sexual problems and vaginal discharge, dysuria and urinary incontinence Integument/breast: negative for breast lump, breast tenderness and nipple discharge Hematologic/lymphatic: negative for bleeding and easy bruising Musculoskeletal:negative for back pain and muscle weakness Neurological: negative for dizziness, headaches, vertigo and weakness Endocrine: negative for diabetic symptoms including polydipsia, polyuria and skin dryness Allergic/Immunologic: negative for hay fever and urticaria       Objective:  Blood pressure 126/76, pulse 81, height 5\' 3"  (1.6 m), weight 146 lb 1.6 oz (66.3 kg), last menstrual period 04/05/2019. Body mass index is 25.88 kg/m.   General Appearance:    Alert, cooperative, no distress, appears stated age  Head:    Normocephalic, without obvious abnormality, atraumatic  Eyes:    PERRL, conjunctiva/corneas clear, EOM's intact, both eyes  Ears:    Normal external ear canals, both ears  Nose:   Nares normal, septum midline, mucosa normal, no drainage or sinus tenderness  Throat:   Lips, mucosa, and tongue normal; teeth and gums normal  Neck:   Supple, symmetrical, trachea midline, no adenopathy; thyroid: no enlargement/tenderness/nodules; no carotid bruit or JVD  Back:     Symmetric, no curvature, ROM normal, no CVA tenderness  Lungs:     Clear to auscultation bilaterally, respirations unlabored  Chest Wall:    No tenderness or deformity   Heart:    Regular rate and rhythm, S1 and S2 normal, no murmur, rub or gallop  Breast Exam:    No tenderness, masses, or nipple abnormality  Abdomen:     Soft,  non-tender, bowel sounds active all four quadrants, no masses, no organomegaly.    Genitalia:    Pelvic:external genitalia normal, vagina without lesions, discharge, or tenderness, rectovaginal septum  normal. Cervix normal in appearance, no cervical motion tenderness, no adnexal masses or tenderness.  Uterus normal size, shape, mobile, regular contours, nontender.  Rectal:    Normal external sphincter.  No hemorrhoids appreciated. Internal exam not done.   Extremities:   Extremities normal, atraumatic, no cyanosis or edema  Pulses:   2+ and symmetric all extremities  Skin:   Skin color, texture, turgor normal, no rashes or lesions  Lymph nodes:   Cervical, supraclavicular, and axillary nodes normal  Neurologic:   CNII-XII intact,  normal strength, sensation and reflexes throughout   .  Labs:  Lab Results  Component Value Date   WBC 6.4 10/06/2011   HGB 12.8 10/06/2011   HCT 28.9 (L) 05/23/2012   MCV 92 10/06/2011   PLT 210 10/06/2011    No results found for: CREATININE, BUN, NA, K, CL, CO2  Lab Results  Component Value Date   ALT 25 10/06/2011     Lab Results  Component Value Date   TSH 1.460 04/17/2018    Lab Results  Component Value Date   CHOL 191 04/17/2018   HDL 87 04/17/2018   LDLCALC 97 04/17/2018   TRIG 35 04/17/2018   CHOLHDL 2.2 04/17/2018     Assessment:    Healthy female exam.   Intermenstrual bleeding  Plan:     Blood tests: CBC with diff and Comprehensive metabolic panel. Breast self exam technique reviewed and patient encouraged to perform self-exam monthly. Contraception: none. Patient notes that she is not opposed to another pregnancy but also not actively trying to conceive.  Discussed healthy lifestyle modifications. Intermenstrual spotting, discussed differential diagnosis (infections, uterine fibroids, polyps, hormone fluctuations, etc.). No evidence of vaginal discharge or infection on exam, no tenderness on exam. Normal uterine size. Will  order pelvic ultrasound to r/o polyps.  Discussed regulation of hormones if no other findings noted (can do short stent of OCPs, ~ 3 months). Will notify patient of results by phone.  Pap smear up to date.  Next due in 1 year.  Flu vaccine up to date.  Return in 1 year for annual exam.    Rubie Maid, MD [facility]

## 2019-04-23 LAB — CBC
Hematocrit: 41.6 % (ref 34.0–46.6)
Hemoglobin: 13.8 g/dL (ref 11.1–15.9)
MCH: 30.1 pg (ref 26.6–33.0)
MCHC: 33.2 g/dL (ref 31.5–35.7)
MCV: 91 fL (ref 79–97)
Platelets: 222 10*3/uL (ref 150–450)
RBC: 4.59 x10E6/uL (ref 3.77–5.28)
RDW: 13.5 % (ref 11.7–15.4)
WBC: 7.2 10*3/uL (ref 3.4–10.8)

## 2019-04-23 LAB — COMPREHENSIVE METABOLIC PANEL
ALT: 14 IU/L (ref 0–32)
AST: 26 IU/L (ref 0–40)
Albumin/Globulin Ratio: 2 (ref 1.2–2.2)
Albumin: 4.9 g/dL — ABNORMAL HIGH (ref 3.8–4.8)
Alkaline Phosphatase: 89 IU/L (ref 39–117)
BUN/Creatinine Ratio: 13 (ref 9–23)
BUN: 8 mg/dL (ref 6–20)
Bilirubin Total: 0.5 mg/dL (ref 0.0–1.2)
CO2: 25 mmol/L (ref 20–29)
Calcium: 9.7 mg/dL (ref 8.7–10.2)
Chloride: 102 mmol/L (ref 96–106)
Creatinine, Ser: 0.61 mg/dL (ref 0.57–1.00)
GFR calc Af Amer: 139 mL/min/{1.73_m2} (ref >59)
GFR calc non Af Amer: 120 mL/min/{1.73_m2} (ref >59)
Globulin, Total: 2.4 g/dL (ref 1.5–4.5)
Glucose: 92 mg/dL (ref 65–99)
Potassium: 4.1 mmol/L (ref 3.5–5.2)
Sodium: 141 mmol/L (ref 134–144)
Total Protein: 7.3 g/dL (ref 6.0–8.5)

## 2019-04-24 ENCOUNTER — Ambulatory Visit (INDEPENDENT_AMBULATORY_CARE_PROVIDER_SITE_OTHER): Payer: 59

## 2019-04-24 ENCOUNTER — Other Ambulatory Visit: Payer: Self-pay

## 2019-04-24 DIAGNOSIS — N83201 Unspecified ovarian cyst, right side: Secondary | ICD-10-CM

## 2019-04-24 DIAGNOSIS — N923 Ovulation bleeding: Secondary | ICD-10-CM

## 2019-04-29 ENCOUNTER — Encounter: Payer: Self-pay | Admitting: Obstetrics and Gynecology

## 2019-04-29 ENCOUNTER — Other Ambulatory Visit: Payer: Self-pay

## 2019-04-29 ENCOUNTER — Ambulatory Visit (INDEPENDENT_AMBULATORY_CARE_PROVIDER_SITE_OTHER): Payer: 59 | Admitting: Obstetrics and Gynecology

## 2019-04-29 VITALS — BP 122/83 | HR 74 | Ht 63.0 in | Wt 147.0 lb

## 2019-04-29 DIAGNOSIS — N858 Other specified noninflammatory disorders of uterus: Secondary | ICD-10-CM | POA: Diagnosis not present

## 2019-04-29 DIAGNOSIS — N923 Ovulation bleeding: Secondary | ICD-10-CM | POA: Diagnosis not present

## 2019-04-29 DIAGNOSIS — N83201 Unspecified ovarian cyst, right side: Secondary | ICD-10-CM

## 2019-04-29 NOTE — Patient Instructions (Signed)
Hysteroscopy Hysteroscopy is a procedure that is used to examine the inside of a woman's womb (uterus). This may be done for various reasons, including:  To look for lumps (tumors) and other growths in the uterus.  To evaluate abnormal bleeding, fibroid tumors, polyps, scar tissue (adhesions), or cancer of the uterus.  To determine the cause of an inability to get pregnant (infertility) or repeated losses of pregnancies (miscarriages).  To find a lost IUD (intrauterine device).  To perform a procedure that permanently prevents pregnancy (sterilization). During this procedure, a thin, flexible tube with a small light and camera (hysteroscope) is used to examine the uterus. The camera sends images to a monitor in the room so that your health care provider can view the inside of your uterus. A hysteroscopy should be done right after a menstrual period to make sure that you are not pregnant. Tell a health care provider about:  Any allergies you have.  All medicines you are taking, including vitamins, herbs, eye drops, creams, and over-the-counter medicines.  Any problems you or family members have had with the use of anesthetic medicines.  Any blood disorders you have.  Any surgeries you have had.  Any medical conditions you have.  Whether you are pregnant or may be pregnant. What are the risks? Generally, this is a safe procedure. However, problems may occur, including:  Excessive bleeding.  Infection.  Damage to the uterus or other structures or organs.  Allergic reaction to medicines or fluids that are used in the procedure. What happens before the procedure? Staying hydrated Follow instructions from your health care provider about hydration, which may include:  Up to 2 hours before the procedure - you may continue to drink clear liquids, such as water, clear fruit juice, black coffee, and plain tea. Eating and drinking restrictions Follow instructions from your health care  provider about eating and drinking, which may include:  8 hours before the procedure - stop eating solid foods and drink clear liquids only  2 hours before the procedure - stop drinking clear liquids. General instructions  Ask your health care provider about: ? Changing or stopping your normal medicines. This is important if you take diabetes medicines or blood thinners. ? Taking medicines such as aspirin and ibuprofen. These medicines can thin your blood and cause bleeding. Do not take these medicines for 1 week before your procedure, or as told by your health care provider.  Do not use any products that contain nicotine or tobacco for 2 weeks before the procedure. This includes cigarettes and e-cigarettes. If you need help quitting, ask your health care provider.  Medicine may be placed in your cervix the day before the procedure. This medicine causes the cervix to have a larger opening (dilate). The larger opening makes it easier for the hysteroscope to be inserted into the uterus during the procedure.  Plan to have someone with you for the first 24-48 hours after the procedure, especially if you are given a medicine to make you fall asleep (general anesthetic).  Plan to have someone take you home from the hospital or clinic. What happens during the procedure?  To lower your risk of infection: ? Your health care team will wash or sanitize their hands. ? Your skin will be washed with soap. ? Hair may be removed from the surgical area.  An IV tube will be inserted into one of your veins.  You may be given one or more of the following: ? A medicine to help   you relax (sedative). ? A medicine that numbs the area around the cervix (local anesthetic). ? A medicine to make you fall asleep (general anesthetic).  A hysteroscope will be inserted through your vagina and into your uterus.  Air or fluid will be used to enlarge your uterus, enabling your health care provider to see your uterus  better. The amount of fluid used will be carefully checked throughout the procedure.  In some cases, tissue may be gently scraped from inside the uterus and sent to a lab for testing (biopsy). The procedure may vary among health care providers and hospitals. What happens after the procedure?  Your blood pressure, heart rate, breathing rate, and blood oxygen level will be monitored until the medicines you were given have worn off.  You may have some cramping. You may be given medicines for this.  You may have bleeding, which varies from light spotting to menstrual-like bleeding. This is normal.  If you had a biopsy done, it is your responsibility to get the results of your procedure. Ask your health care provider, or the department performing the procedure, when your results will be ready. Summary  Hysteroscopy is a procedure that is used to examine the inside of a woman's womb (uterus).  After the procedure, you may have bleeding, which varies from light spotting to menstrual-like bleeding. This is normal. You may also have cramping.  Plan to have someone take you home from the hospital or clinic. This information is not intended to replace advice given to you by your health care provider. Make sure you discuss any questions you have with your health care provider. Document Released: 09/04/2000 Document Revised: 05/11/2017 Document Reviewed: 06/27/2016 Elsevier Patient Education  2020 [employer] Inc[employer]  

## 2019-04-29 NOTE — Progress Notes (Signed)
Pt present for u/s follow up. Pt stated that she was doing well no problems.

## 2019-04-29 NOTE — Progress Notes (Signed)
    GYNECOLOGY PROGRESS NOTE  Subjective:    Patient ID: Caitlyn Blackburn, female    DOB: 06/30/1985, 33 y.o.   MRN: [id number]  HPI  Patient is a 33 y.o. G72P1011 female who presents for discussion of ultrasound results.  Ultrasound performed for h/o intermenstrual spotting x 1 year, worsening over the past 6 months. Denies complaints today.   The following portions of the patient's history were reviewed and updated as appropriate: allergies, current medications, past family history, past medical history, past social history, past surgical history and problem list.  Review of Systems Pertinent items noted in HPI and remainder of comprehensive ROS otherwise negative.   Objective:   Blood pressure 122/83, pulse 74, height 5\' 3"  (1.6 m), weight 147 lb (66.7 kg), last menstrual period 04/05/2019. General appearance: alert and no distress Remainder of exam deferred.    Imaging:  [place] GYN Pelvis Complete Patient Name: Caitlyn Blackburn DOB: [birth year] MRN: [id number] ULTRASOUND REPORT  Location: [facility]  Date of Service: 04/24/2019   Indications:AUB Findings:  The uterus is anteverted and measures 9.1 x 4.5 x 5.3 cm. Echo texture is homogenous without evidence of focal masses.  The Endometrium measures 14 mm.Echogenic lesion with-in the fundal portion  of the endometrium measuring 1.2 x 1.1 x 1.3 cm  Right Ovary measures 4.1 x 2.9 x 2.9  cm. It is normal in appearance.  Hypoechoic lesion with good through transmission measuring 2.4 x 2.3 x 1.7  cm Left Ovary measures 2.7 x 2.1 x 1.9 cm. It is normal in appearance. Survey of the adnexa demonstrates no adnexal masses. There is no free fluid in the cul de sac.  Impression: 1. Endometrial foci, possible polyp versus submucosal fibroid as described  above. 2. Rt ovarian simple cyst as described above  Recommendations: 1.Clinical correlation with the patient's History and Physical Exam.  Jenine M. Albertine Grates    RDMS  I have  reviewed this study and agree with documented findings.   Rubie Maid, MD [facility]   Assessment:   Intermenstrual spotting Uterine mass (polyp vs fibroid) Right ovarian cyst  Plan:   1. Discussion had with patient regarding ultrasound findings. Cannot distinguish if uterine mass is a polyp vs submucosal fibroid.   Treatment options discussed, including expectant management, hormonal suppression (although if mass is a polyp, may not completely manage her intermenstrual spotting), or surgical management with Hysteroscopy D&C with polypectomy. Patient notes she is leaning towards surgical option. Would like to have surgery done after the holidays (tentatively date selected is January 11th).  Briefly discussed nature of the procedure, risks/benefits.  Patient will return for pre-op exam in early January.    A total of 15 minutes were spent face-to-face with the patient during this encounter and over half of that time dealt with counseling and coordination of care.   Rubie Maid, MD [facility]

## 2019-04-30 ENCOUNTER — Encounter: Payer: Self-pay | Admitting: Obstetrics and Gynecology

## 2019-06-13 HISTORY — PX: DILATION AND CURETTAGE OF UTERUS: SHX78

## 2019-06-16 ENCOUNTER — Other Ambulatory Visit: Payer: 59

## 2019-06-18 ENCOUNTER — Encounter
Admission: RE | Admit: 2019-06-18 | Discharge: 2019-06-18 | Disposition: A | Discharge status: Home / Self Care | Payer: 59 | Source: Ambulatory Visit | Attending: Obstetrics and Gynecology | Admitting: Obstetrics and Gynecology

## 2019-06-18 ENCOUNTER — Encounter: Payer: Self-pay | Admitting: Obstetrics and Gynecology

## 2019-06-18 ENCOUNTER — Other Ambulatory Visit: Payer: Self-pay

## 2019-06-18 ENCOUNTER — Ambulatory Visit (INDEPENDENT_AMBULATORY_CARE_PROVIDER_SITE_OTHER): Payer: 59 | Admitting: Obstetrics and Gynecology

## 2019-06-18 VITALS — BP 116/78 | HR 76 | Ht 63.0 in | Wt 147.9 lb

## 2019-06-18 DIAGNOSIS — N923 Ovulation bleeding: Secondary | ICD-10-CM

## 2019-06-18 DIAGNOSIS — N9489 Other specified conditions associated with female genital organs and menstrual cycle: Secondary | ICD-10-CM

## 2019-06-18 DIAGNOSIS — Z01818 Encounter for other preprocedural examination: Secondary | ICD-10-CM

## 2019-06-18 DIAGNOSIS — N920 Excessive and frequent menstruation with regular cycle: Secondary | ICD-10-CM

## 2019-06-18 NOTE — Progress Notes (Signed)
Pt present for pre-op exam. Pt has surgery on 06/23/19 for d/c and hysteroscopy. Pt stated that she was doing well no problems.

## 2019-06-18 NOTE — H&P (Signed)
GYNECOLOGY PREOPERATIVE HISTORY AND PHYSICAL   Subjective:  Caitlyn Blackburn is a 34 y.o. G2P1011 here for surgical management of intermenstrual spotting and endometrial mass (polyp vs fibroid).  No significant preoperative concerns.   Proposed surgery: Hysteroscopy D&C with polypectomy (vs myomectomy).    Pertinent Gynecological History: Menses: flow is moderate and regular every month with spotting approximately 5-7 days per month Bleeding: intermenstrual bleeding Contraception: OCP (estrogen/progesterone) Last pap: normal Date: 04/10/2017   Past Medical History:  Diagnosis Date  . Abnormal Pap smear of vagina    10 years ago  . History of migraine     Past Surgical History:  Procedure Laterality Date  . WISDOM TOOTH EXTRACTION      OB History  Gravida Para Term Preterm AB Living  2 1 1   1 1   SAB TAB Ectopic Multiple Live Births  1       1    # Outcome Date GA Lbr Len/2nd Weight Sex Delivery Anes PTL Lv  2 SAB 2018          1 Term 2013   7 lb 1.6 oz (3.221 kg) M Vag-Spont   LIV    Family History  Problem Relation Age of Onset  . Alcohol abuse Maternal Grandfather   . Prostate cancer Maternal Grandfather   . Arthritis Maternal Grandmother   . Hyperlipidemia Maternal Grandmother   . Hypertension Maternal Grandmother   . Breast cancer Paternal Grandmother   . Heart disease Paternal Grandmother   . Breast cancer Maternal Aunt   . Healthy Mother   . Healthy Father   . Colon cancer Neg Hx   . Ovarian cancer Neg Hx   . Diabetes Neg Hx     Social History   Socioeconomic History  . Marital status: Married    Spouse name: Not on file  . Number of children: Not on file  . Years of education: Not on file  . Highest education level: Not on file  Occupational History  . Not on file  Tobacco Use  . Smoking status: Never Smoker  . Smokeless tobacco: Never Used  Substance and Sexual Activity  . Alcohol use: Yes    Comment: occ  . Drug use: No  . Sexual  activity: Yes    Birth control/protection: None  Other Topics Concern  . Not on file  Social History Narrative  . Not on file   Social Determinants of Health   Financial Resource Strain:   . Difficulty of Paying Living Expenses: Not on file  Food Insecurity:   . Worried About 2014 in the Last Year: Not on file  . Ran Out of Food in the Last Year: Not on file  Transportation Needs:   . Lack of Transportation (Medical): Not on file  . Lack of Transportation (Non-Medical): Not on file  Physical Activity:   . Days of Exercise per Week: Not on file  . Minutes of Exercise per Session: Not on file  Stress:   . Feeling of Stress : Not on file  Social Connections:   . Frequency of Communication with Friends and Family: Not on file  . Frequency of Social Gatherings with Friends and Family: Not on file  . Attends Religious Services: Not on file  . Active Member of Clubs or Organizations: Not on file  . Attends Programme researcher, broadcasting/film/video Meetings: Not on file  . Marital Status: Not on file  Intimate Partner Violence:   .  Fear of Current or Ex-Partner: Not on file  . Emotionally Abused: Not on file  . Physically Abused: Not on file  . Sexually Abused: Not on file    Current Outpatient Medications on File Prior to Visit  Medication Sig Dispense Refill  . Prenatal Vit-Fe Fumarate-FA (PRENATAL MULTIVITAMIN) TABS tablet Take 1 tablet by mouth daily.     No current facility-administered medications on file prior to visit.   No Known Allergies    Review of Systems Constitutional: No recent fever/chills/sweats Respiratory: No recent cough/bronchitis Cardiovascular: No chest pain Gastrointestinal: No recent nausea/vomiting/diarrhea Genitourinary: No UTI symptoms Hematologic/lymphatic:No history of coagulopathy or recent blood thinner use    Objective:   Blood pressure 116/78, pulse 76, height 5' 3" (1.6 m), weight 147 lb 14.4 oz (67.1 kg), last menstrual period 06/12/2019.  CONSTITUTIONAL: Well-developed, well-nourished female in no acute distress.  HENT:  Normocephalic, atraumatic, External right and left ear normal. Oropharynx is clear and moist EYES: Conjunctivae and EOM are normal. Pupils are equal, round, and reactive to light. No scleral icterus.  NECK: Normal range of motion, supple, no masses SKIN: Skin is warm and dry. No rash noted. Not diaphoretic. No erythema. No pallor. NEUROLOGIC: Alert and oriented to person, place, and time. Normal reflexes, muscle tone coordination. No cranial nerve deficit noted. PSYCHIATRIC: Normal mood and affect. Normal behavior. Normal judgment and thought content. CARDIOVASCULAR: Normal heart rate noted, regular rhythm RESPIRATORY: Effort and breath sounds normal, no problems with respiration noted ABDOMEN: Soft, nontender, nondistended. PELVIC: Deferred MUSCULOSKELETAL: Normal range of motion. No edema and no tenderness. 2+ distal pulses.    Labs: No results found for this or any previous visit (from the past 336 hour(s)).   Imaging Studies: US GYN Pelvis Complete Patient Name: Caitlyn Blackburn DOB: 07/30/1985 MRN: [id number] ULTRASOUND REPORT  Location: [facility]  Date of Service: 04/24/2019   Indications:AUB Findings:  The uterus is anteverted and measures 9.1 x 4.5 x 5.3 cm. Echo texture is homogenous without evidence of focal masses.  The Endometrium measures 14 mm.Echogenic lesion with-in the fundal portion  of the endometrium measuring 1.2 x 1.1 x 1.3 cm  Right Ovary measures 4.1 x 2.9 x 2.9  cm. It is normal in appearance.  Hypoechoic lesion with good through transmission measuring 2.4 x 2.3 x 1.7  cm Left Ovary measures 2.7 x 2.1 x 1.9 cm. It is normal in appearance. Survey of the adnexa demonstrates no adnexal masses. There is no free fluid in the cul de sac.  Impression: 1. Endometrial foci, possible polyp versus submucosal fibroid as described  above. 2. Rt ovarian simple cyst as  described above  Recommendations: 1.Clinical correlation with the patient's History and Physical Exam.  Jenine M. Alessi    RDMS  I have reviewed this study and agree with documented findings.   Paxson Harrower, MD [facility]   Assessment:    Pre-op examination Intermenstrual spotting Endometrial mass  Plan:    Counseling: Procedure, risks, reasons, benefits and complications (including injury to bowel, bladder, major blood vessel, ureter, bleeding, possibility of transfusion, infection, or fistula formation) reviewed in detail. Likelihood of success in alleviating the patient's condition was discussed. Routine postoperative instructions will be reviewed with the patient and her family in detail after surgery.  The patient concurred with the proposed plan, giving informed written consent for the surgery.   Preop testing ordered. Instructions reviewed, including NPO after midnight.   Ulonda Klosowski, MD [facility]   

## 2019-06-18 NOTE — Progress Notes (Signed)
GYNECOLOGY PROGRESS NOTE  Subjective:    Patient ID: Caitlyn Blackburn, female    DOB: November 24, 1985, 34 y.o.   MRN: [id number]  HPI  Patient is a 34 y.o. G38P1011 female who presents for pre-operative examination for hysteroscopy D&C with polypectomy.  Indications for procedure: Intermenstrual spotting.  Denies major complaints today.   The following portions of the patient's history were reviewed and updated as appropriate: allergies, current medications, past family history, past medical history, past social history, past surgical history and problem list.  Review of Systems Pertinent items noted in HPI and remainder of comprehensive ROS otherwise negative.   Objective:   Blood pressure 116/78, pulse 76, height 5\' 3"  (1.6 m), weight 147 lb 14.4 oz (67.1 kg), last menstrual period 06/12/2019. General appearance: alert and no distress Abdomen: soft, non-tender; bowel sounds normal; no masses,  no organomegaly Pelvic: external genitalia normal, rectovaginal septum normal. Speculum exam not performed. Cervix with no motion tenderness.  Uterus mobile, nontender, normal shape and size.  Adnexae non-palpable, nontender bilaterally.     Imaging:  [place] GYN Pelvis Complete Patient Name: Caitlyn Blackburn DOB: [birth year] MRN: [id number] ULTRASOUND REPORT  Location: [facility]  Date of Service: 04/24/2019   Indications:AUB Findings:  The uterus is anteverted and measures 9.1 x 4.5 x 5.3 cm. Echo texture is homogenous without evidence of focal masses.  The Endometrium measures 14 mm.Echogenic lesion with-in the fundal portion  of the endometrium measuring 1.2 x 1.1 x 1.3 cm  Right Ovary measures 4.1 x 2.9 x 2.9  cm. It is normal in appearance.  Hypoechoic lesion with good through transmission measuring 2.4 x 2.3 x 1.7  cm Left Ovary measures 2.7 x 2.1 x 1.9 cm. It is normal in appearance. Survey of the adnexa demonstrates no adnexal masses. There is no free fluid in the cul de  sac.  Impression: 1. Endometrial foci, possible polyp versus submucosal fibroid as described  above. 2. Rt ovarian simple cyst as described above  Recommendations: 1.Clinical correlation with the patient's History and Physical Exam.  Jenine M. Albertine Grates    RDMS  I have reviewed this study and agree with documented findings.   Rubie Maid, MD [facility]   Assessment:   Pre-op examination Intermenstrual spotting Endometrial mass (polyp vs fibroid)  Plan:   Patient desires surgical management with Hysteroscopy D&C with removal of endometrial mass (poyp vs fibroid).  The risks of surgery were discussed in detail with the patient including but not limited to: bleeding which may require transfusion or reoperation; infection which may require prolonged hospitalization or re-hospitalization and antibiotic therapy; injury to bowel, bladder, ureters and major vessels or other surrounding organs; need for additional procedures including laparotomy; thromboembolic phenomenon, incisional problems and other postoperative or anesthesia complications.  Patient was told that the likelihood that her condition and symptoms will be treated effectively with this surgical management was very high; the postoperative expectations were also discussed in detail. The patient also understands the alternative treatment options which were discussed in full. All questions were answered.  She was told that she will be contacted by our surgical scheduler regarding the time and date of her surgery; routine preoperative instructions will be given to her by the preoperative nursing team. She is aware of need for preoperative COVID testing and subsequent quarantine from time of test to time of surgery; she will be given further preoperative instructions at that [place] screening visit. Printed patient education handouts about the procedure were given  to the patient to review at home. Surgery scheduled for 06/23/2019.     Hildred Laser, MD [facility]

## 2019-06-18 NOTE — Patient Instructions (Signed)
You are scheduled for surgery on 06/23/2019.  Nothing to eat after midnight on day prior to surgery.  Do not take any medications unless recommended by your provider on day prior to surgery.  Do not take NSAIDs (Motrin, Aleve) or aspirin 7 days prior to surgery if you take these consistently.  You may take Tylenol products for minor aches and pains.  You will receive a prescription for pain medications post-operatively.  You will be contacted by phone approximately 1 week prior to surgery to schedule pre-operative appointment.  Please call the office if you have any questions regarding your upcoming surgery.

## 2019-06-18 NOTE — Patient Instructions (Addendum)
Your COVID test is scheduled on: Thursday 06/19/19. Drive up in front of the [facility] any time 8:00-10:30am. Your procedure is scheduled on: Monday 06/23/19 Report to Same Day Surgery 2nd floor [facility] Entrance-take elevator on left to 2nd floor.  Check in with surgery information desk.) To find out your arrival time, call [phone] 1:00-3:00 PM on Friday 06/20/19  Remember: Instructions that are not followed completely may result in serious medical risk, up to and including death, or upon the discretion of your surgeon and anesthesiologist your surgery may need to be rescheduled.   __x__ 1. Do not eat food (including mints, candies, chewing gum) after midnight the night before your procedure. You may drink clear liquids up to 2 hours before you are scheduled to arrive at the hospital for your procedure.  Do not drink anything within 2 hours of your scheduled arrival to the hospital.  Approved clear liquids:  --Water or Apple juice without pulp  --Clear carbohydrate beverage such as Gatorade or Powerade  --Black Coffee or Clear Tea (No milk, no creamers, do not add anything to the coffee or tea)  _____ 2.  Finish your provided Ensure clear carbohydrate drink 2 hours before your scheduled arrival time.   __x__ 3. No Alcohol or smoking for 24 hours before or after surgery.  __x__ 4. Notify your doctor if there is any change in your medical condition (cold, fever, infections).  __x__ 5. On the morning of surgery brush your teeth with toothpaste and water.  You may rinse your mouth with mouthwash if you wish.  Do not swallow any toothpaste or mouthwash.  __x__ 6.  Use antibacterial soap such as Dial, if available, to shower on the morning of surgery.  Do not wear jewelry, make-up, hairpins, clips or nail polish on the day of surgery. Do not wear lotions, powders, deodorant, or perfumes on the day of surgery.  Do not shave below the face/neck 48 hours prior to  surgery.  Do not bring valuables to the hospital.  [facility] is not responsible for any belongings or valuables.               Eyeglasses may not be worn into surgery.    For patients discharged on the day of surgery, you will NOT be permitted to drive yourself home.  You must have a responsible adult with you for 24 hours after surgery.  __x__ Take these medicines with a SMALL SIP OF WATER on the morning of surgery:  1. NONE  ____ If you take any blood thinners, follow recommendations from your doctor regarding stopping them before surgery.  __x__ STARTING TODAY: Do not take any Anti-inflammatories such as Advil, Ibuprofen, Motrin, Aleve, Naproxen, Naprosyn, BC/Goodies powders or aspirin products until after your procedure.  __x__ STARTING TODAY: Do not take any over the counter supplements until after surgery. (No need to stop your pre-natal vitamin before surgery.)

## 2019-06-18 NOTE — H&P (View-Only) (Signed)
GYNECOLOGY PREOPERATIVE HISTORY AND PHYSICAL   Subjective:  Caitlyn Blackburn is a 34 y.o. G2P1011 here for surgical management of intermenstrual spotting and endometrial mass (polyp vs fibroid).  No significant preoperative concerns.   Proposed surgery: Hysteroscopy D&C with polypectomy (vs myomectomy).    Pertinent Gynecological History: Menses: flow is moderate and regular every month with spotting approximately 5-7 days per month Bleeding: intermenstrual bleeding Contraception: OCP (estrogen/progesterone) Last pap: normal Date: 04/10/2017   Past Medical History:  Diagnosis Date  . Abnormal Pap smear of vagina    10 years ago  . History of migraine     Past Surgical History:  Procedure Laterality Date  . WISDOM TOOTH EXTRACTION      OB History  Gravida Para Term Preterm AB Living  2 1 1   1 1   SAB TAB Ectopic Multiple Live Births  1       1    # Outcome Date GA Lbr Len/2nd Weight Sex Delivery Anes PTL Lv  2 SAB 2018          1 Term 2013   7 lb 1.6 oz (3.221 kg) M Vag-Spont   LIV    Family History  Problem Relation Age of Onset  . Alcohol abuse Maternal Grandfather   . Prostate cancer Maternal Grandfather   . Arthritis Maternal Grandmother   . Hyperlipidemia Maternal Grandmother   . Hypertension Maternal Grandmother   . Breast cancer Paternal Grandmother   . Heart disease Paternal Grandmother   . Breast cancer Maternal Aunt   . Healthy Mother   . Healthy Father   . Colon cancer Neg Hx   . Ovarian cancer Neg Hx   . Diabetes Neg Hx     Social History   Socioeconomic History  . Marital status: Married    Spouse name: Not on file  . Number of children: Not on file  . Years of education: Not on file  . Highest education level: Not on file  Occupational History  . Not on file  Tobacco Use  . Smoking status: Never Smoker  . Smokeless tobacco: Never Used  Substance and Sexual Activity  . Alcohol use: Yes    Comment: occ  . Drug use: No  . Sexual  activity: Yes    Birth control/protection: None  Other Topics Concern  . Not on file  Social History Narrative  . Not on file   Social Determinants of Health   Financial Resource Strain:   . Difficulty of Paying Living Expenses: Not on file  Food Insecurity:   . Worried About 2014 in the Last Year: Not on file  . Ran Out of Food in the Last Year: Not on file  Transportation Needs:   . Lack of Transportation (Medical): Not on file  . Lack of Transportation (Non-Medical): Not on file  Physical Activity:   . Days of Exercise per Week: Not on file  . Minutes of Exercise per Session: Not on file  Stress:   . Feeling of Stress : Not on file  Social Connections:   . Frequency of Communication with Friends and Family: Not on file  . Frequency of Social Gatherings with Friends and Family: Not on file  . Attends Religious Services: Not on file  . Active Member of Clubs or Organizations: Not on file  . Attends Programme researcher, broadcasting/film/video Meetings: Not on file  . Marital Status: Not on file  Intimate Partner Violence:   .  Fear of Current or Ex-Partner: Not on file  . Emotionally Abused: Not on file  . Physically Abused: Not on file  . Sexually Abused: Not on file    Current Outpatient Medications on File Prior to Visit  Medication Sig Dispense Refill  . Prenatal Vit-Fe Fumarate-FA (PRENATAL MULTIVITAMIN) TABS tablet Take 1 tablet by mouth daily.     No current facility-administered medications on file prior to visit.   No Known Allergies    Review of Systems Constitutional: No recent fever/chills/sweats Respiratory: No recent cough/bronchitis Cardiovascular: No chest pain Gastrointestinal: No recent nausea/vomiting/diarrhea Genitourinary: No UTI symptoms Hematologic/lymphatic:No history of coagulopathy or recent blood thinner use    Objective:   Blood pressure 116/78, pulse 76, height 5\' 3"  (1.6 m), weight 147 lb 14.4 oz (67.1 kg), last menstrual period 06/12/2019.  CONSTITUTIONAL: Well-developed, well-nourished female in no acute distress.  HENT:  Normocephalic, atraumatic, External right and left ear normal. Oropharynx is clear and moist EYES: Conjunctivae and EOM are normal. Pupils are equal, round, and reactive to light. No scleral icterus.  NECK: Normal range of motion, supple, no masses SKIN: Skin is warm and dry. No rash noted. Not diaphoretic. No erythema. No pallor. NEUROLOGIC: Alert and oriented to person, place, and time. Normal reflexes, muscle tone coordination. No cranial nerve deficit noted. PSYCHIATRIC: Normal mood and affect. Normal behavior. Normal judgment and thought content. CARDIOVASCULAR: Normal heart rate noted, regular rhythm RESPIRATORY: Effort and breath sounds normal, no problems with respiration noted ABDOMEN: Soft, nontender, nondistended. PELVIC: Deferred MUSCULOSKELETAL: Normal range of motion. No edema and no tenderness. 2+ distal pulses.    Labs: No results found for this or any previous visit (from the past 336 hour(s)).   Imaging Studies: [place] GYN Pelvis Complete Patient Name: Caitlyn Blackburn DOB: 1985-07-30 MRN: [id number] ULTRASOUND REPORT  Location: [facility]  Date of Service: 04/24/2019   Indications:AUB Findings:  The uterus is anteverted and measures 9.1 x 4.5 x 5.3 cm. Echo texture is homogenous without evidence of focal masses.  The Endometrium measures 14 mm.Echogenic lesion with-in the fundal portion  of the endometrium measuring 1.2 x 1.1 x 1.3 cm  Right Ovary measures 4.1 x 2.9 x 2.9  cm. It is normal in appearance.  Hypoechoic lesion with good through transmission measuring 2.4 x 2.3 x 1.7  cm Left Ovary measures 2.7 x 2.1 x 1.9 cm. It is normal in appearance. Survey of the adnexa demonstrates no adnexal masses. There is no free fluid in the cul de sac.  Impression: 1. Endometrial foci, possible polyp versus submucosal fibroid as described  above. 2. Rt ovarian simple cyst as  described above  Recommendations: 1.Clinical correlation with the patient's History and Physical Exam.  Jenine M. Albertine Grates    RDMS  I have reviewed this study and agree with documented findings.   Rubie Maid, MD [facility]   Assessment:    Pre-op examination Intermenstrual spotting Endometrial mass  Plan:    Counseling: Procedure, risks, reasons, benefits and complications (including injury to bowel, bladder, major blood vessel, ureter, bleeding, possibility of transfusion, infection, or fistula formation) reviewed in detail. Likelihood of success in alleviating the patient's condition was discussed. Routine postoperative instructions will be reviewed with the patient and her family in detail after surgery.  The patient concurred with the proposed plan, giving informed written consent for the surgery.   Preop testing ordered. Instructions reviewed, including NPO after midnight.   Rubie Maid, MD [facility]

## 2019-06-19 ENCOUNTER — Other Ambulatory Visit
Admission: RE | Admit: 2019-06-19 | Discharge: 2019-06-19 | Disposition: A | Discharge status: Home / Self Care | Payer: 59 | Source: Ambulatory Visit | Attending: Obstetrics and Gynecology | Admitting: Obstetrics and Gynecology

## 2019-06-19 DIAGNOSIS — Z20822 Contact with and (suspected) exposure to covid-19: Secondary | ICD-10-CM | POA: Insufficient documentation

## 2019-06-19 DIAGNOSIS — Z01812 Encounter for preprocedural laboratory examination: Secondary | ICD-10-CM | POA: Diagnosis not present

## 2019-06-20 LAB — SARS CORONAVIRUS 2 (TAT 6-24 HRS): SARS Coronavirus 2: NEGATIVE

## 2019-06-23 ENCOUNTER — Ambulatory Visit: Payer: 59 | Admitting: Anesthesiology

## 2019-06-23 ENCOUNTER — Encounter: Payer: Self-pay | Admitting: Obstetrics and Gynecology

## 2019-06-23 ENCOUNTER — Other Ambulatory Visit: Payer: Self-pay

## 2019-06-23 ENCOUNTER — Ambulatory Visit
Admission: RE | Admit: 2019-06-23 | Discharge: 2019-06-23 | Disposition: A | Discharge status: Home / Self Care | Payer: 59 | Attending: Obstetrics and Gynecology | Admitting: Obstetrics and Gynecology

## 2019-06-23 ENCOUNTER — Encounter
Admission: RE | Disposition: A | Discharge status: Home / Self Care | Payer: Self-pay | Source: Home / Self Care | Attending: Obstetrics and Gynecology

## 2019-06-23 DIAGNOSIS — N921 Excessive and frequent menstruation with irregular cycle: Secondary | ICD-10-CM | POA: Diagnosis not present

## 2019-06-23 DIAGNOSIS — N923 Ovulation bleeding: Secondary | ICD-10-CM | POA: Diagnosis not present

## 2019-06-23 DIAGNOSIS — N840 Polyp of corpus uteri: Secondary | ICD-10-CM | POA: Diagnosis not present

## 2019-06-23 DIAGNOSIS — N858 Other specified noninflammatory disorders of uterus: Secondary | ICD-10-CM | POA: Insufficient documentation

## 2019-06-23 DIAGNOSIS — N939 Abnormal uterine and vaginal bleeding, unspecified: Secondary | ICD-10-CM

## 2019-06-23 HISTORY — PX: DILATATION & CURETTAGE/HYSTEROSCOPY WITH MYOSURE: SHX6511

## 2019-06-23 LAB — POCT PREGNANCY, URINE: Preg Test, Ur: NEGATIVE

## 2019-06-23 SURGERY — DILATATION & CURETTAGE/HYSTEROSCOPY WITH MYOSURE
Anesthesia: General

## 2019-06-23 MED ORDER — ONDANSETRON HCL 4 MG/2ML IJ SOLN
INTRAMUSCULAR | Status: DC | PRN
  Administered 2019-06-23 (×2): 4 mg via INTRAVENOUS

## 2019-06-23 MED ORDER — ACETAMINOPHEN 500 MG PO TABS
ORAL_TABLET | ORAL | Status: AC
  Filled 2019-06-23: qty 2

## 2019-06-23 MED ORDER — GLYCOPYRROLATE 0.2 MG/ML IJ SOLN
INTRAMUSCULAR | Status: DC | PRN
  Administered 2019-06-23: .2 mg via INTRAVENOUS

## 2019-06-23 MED ORDER — OXYCODONE HCL 5 MG PO TABS: 5.0000 mg | ORAL_TABLET | Freq: Once | ORAL | Status: DC | PRN

## 2019-06-23 MED ORDER — ONDANSETRON HCL 4 MG/2ML IJ SOLN
INTRAMUSCULAR | Status: AC
  Filled 2019-06-23: qty 4

## 2019-06-23 MED ORDER — GLYCOPYRROLATE 0.2 MG/ML IJ SOLN
INTRAMUSCULAR | Status: AC
  Filled 2019-06-23: qty 1

## 2019-06-23 MED ORDER — ACETAMINOPHEN 10 MG/ML IV SOLN: 1000.0000 mg | Freq: Once | INTRAVENOUS | Status: DC | PRN

## 2019-06-23 MED ORDER — FAMOTIDINE 20 MG PO TABS
20.0000 mg | ORAL_TABLET | Freq: Once | ORAL | Status: AC
  Administered 2019-06-23: 20 mg via ORAL

## 2019-06-23 MED ORDER — ACETAMINOPHEN 500 MG PO TABS
1000.0000 mg | ORAL_TABLET | ORAL | Status: AC
  Administered 2019-06-23: 1000 mg via ORAL

## 2019-06-23 MED ORDER — LACTATED RINGERS IV SOLN: INTRAVENOUS | Status: DC

## 2019-06-23 MED ORDER — FENTANYL CITRATE (PF) 100 MCG/2ML IJ SOLN
INTRAMUSCULAR | Status: AC
  Filled 2019-06-23: qty 2

## 2019-06-23 MED ORDER — FENTANYL CITRATE (PF) 100 MCG/2ML IJ SOLN
INTRAMUSCULAR | Status: DC | PRN
  Administered 2019-06-23 (×2): 50 ug via INTRAVENOUS

## 2019-06-23 MED ORDER — ROCURONIUM BROMIDE 100 MG/10ML IV SOLN
INTRAVENOUS | Status: DC | PRN
  Administered 2019-06-23: 30 mg via INTRAVENOUS
  Administered 2019-06-23: 10 mg via INTRAVENOUS

## 2019-06-23 MED ORDER — MIDAZOLAM HCL 2 MG/2ML IJ SOLN
INTRAMUSCULAR | Status: AC
  Filled 2019-06-23: qty 2

## 2019-06-23 MED ORDER — KETOROLAC TROMETHAMINE 30 MG/ML IJ SOLN: INTRAMUSCULAR | Status: DC | PRN

## 2019-06-23 MED ORDER — FAMOTIDINE 20 MG PO TABS
ORAL_TABLET | ORAL | Status: AC
  Filled 2019-06-23: qty 1

## 2019-06-23 MED ORDER — IBUPROFEN 800 MG PO TABS: 800.0000 mg | ORAL_TABLET | Freq: Three times a day (TID) | ORAL | 1 refills | Status: DC | PRN

## 2019-06-23 MED ORDER — LIDOCAINE HCL (CARDIAC) PF 100 MG/5ML IV SOSY
PREFILLED_SYRINGE | INTRAVENOUS | Status: DC | PRN
  Administered 2019-06-23: 80 mg via INTRAVENOUS

## 2019-06-23 MED ORDER — DEXAMETHASONE SODIUM PHOSPHATE 10 MG/ML IJ SOLN
INTRAMUSCULAR | Status: DC | PRN
  Administered 2019-06-23: 10 mg via INTRAVENOUS

## 2019-06-23 MED ORDER — SUGAMMADEX SODIUM 200 MG/2ML IV SOLN
INTRAVENOUS | Status: AC
  Filled 2019-06-23: qty 2

## 2019-06-23 MED ORDER — MIDAZOLAM HCL 2 MG/2ML IJ SOLN
INTRAMUSCULAR | Status: DC | PRN
  Administered 2019-06-23: 2 mg via INTRAVENOUS

## 2019-06-23 MED ORDER — PROPOFOL 500 MG/50ML IV EMUL
INTRAVENOUS | Status: AC
  Filled 2019-06-23: qty 50

## 2019-06-23 MED ORDER — IBUPROFEN 800 MG PO TABS
800.0000 mg | ORAL_TABLET | Freq: Three times a day (TID) | ORAL | Status: DC | PRN
  Administered 2019-06-23: 800 mg via ORAL
  Filled 2019-06-23: qty 1

## 2019-06-23 MED ORDER — LIDOCAINE HCL 1 % IJ SOLN
INTRAMUSCULAR | Status: DC | PRN
  Administered 2019-06-23: 10 mL

## 2019-06-23 MED ORDER — PROPOFOL 10 MG/ML IV BOLUS
INTRAVENOUS | Status: AC
  Filled 2019-06-23: qty 20

## 2019-06-23 MED ORDER — PHENYLEPHRINE HCL (PRESSORS) 10 MG/ML IV SOLN
INTRAVENOUS | Status: AC
  Filled 2019-06-23: qty 1

## 2019-06-23 MED ORDER — EPHEDRINE SULFATE 50 MG/ML IJ SOLN
INTRAMUSCULAR | Status: AC
  Filled 2019-06-23: qty 1

## 2019-06-23 MED ORDER — DEXAMETHASONE SODIUM PHOSPHATE 10 MG/ML IJ SOLN
INTRAMUSCULAR | Status: AC
  Filled 2019-06-23: qty 1

## 2019-06-23 MED ORDER — LIDOCAINE HCL URETHRAL/MUCOSAL 2 % EX GEL
CUTANEOUS | Status: AC
  Filled 2019-06-23: qty 5

## 2019-06-23 MED ORDER — ROCURONIUM BROMIDE 50 MG/5ML IV SOLN
INTRAVENOUS | Status: AC
  Filled 2019-06-23: qty 1

## 2019-06-23 MED ORDER — FENTANYL CITRATE (PF) 100 MCG/2ML IJ SOLN
25.0000 ug | INTRAMUSCULAR | Status: DC | PRN
  Administered 2019-06-23: 25 ug via INTRAVENOUS

## 2019-06-23 MED ORDER — SUCCINYLCHOLINE CHLORIDE 20 MG/ML IJ SOLN
INTRAMUSCULAR | Status: AC
  Filled 2019-06-23: qty 1

## 2019-06-23 MED ORDER — PROPOFOL 10 MG/ML IV BOLUS
INTRAVENOUS | Status: DC | PRN
  Administered 2019-06-23: 160 mg via INTRAVENOUS

## 2019-06-23 MED ORDER — PROPOFOL 500 MG/50ML IV EMUL
INTRAVENOUS | Status: DC | PRN
  Administered 2019-06-23: 160 ug/kg/min via INTRAVENOUS

## 2019-06-23 MED ORDER — LIDOCAINE HCL (PF) 1 % IJ SOLN
INTRAMUSCULAR | Status: AC
  Filled 2019-06-23: qty 30

## 2019-06-23 MED ORDER — SUGAMMADEX SODIUM 200 MG/2ML IV SOLN
INTRAVENOUS | Status: DC | PRN
  Administered 2019-06-23: 150 mg via INTRAVENOUS

## 2019-06-23 MED ORDER — ONDANSETRON HCL 4 MG/2ML IJ SOLN: 4.0000 mg | Freq: Once | INTRAMUSCULAR | Status: DC | PRN

## 2019-06-23 MED ORDER — IBUPROFEN 800 MG PO TABS
ORAL_TABLET | ORAL | Status: AC
  Filled 2019-06-23: qty 1

## 2019-06-23 MED ORDER — OXYCODONE HCL 5 MG/5ML PO SOLN: 5.0000 mg | Freq: Once | ORAL | Status: DC | PRN

## 2019-06-23 SURGICAL SUPPLY — 20 items
CANISTER SUC SOCK COL 7IN (MISCELLANEOUS) IMPLANT
CATH ROBINSON RED A/P 16FR (CATHETERS) ×3 IMPLANT
COVER WAND RF STERILE (DRAPES) IMPLANT
DEVICE MYOSURE LITE (MISCELLANEOUS) ×3 IMPLANT
ELECT REM PT RETURN 9FT ADLT (ELECTROSURGICAL) ×3
ELECTRODE REM PT RTRN 9FT ADLT (ELECTROSURGICAL) ×1 IMPLANT
GLOVE BIO SURGEON STRL SZ 6.5 (GLOVE) ×2 IMPLANT
GLOVE BIO SURGEONS STRL SZ 6.5 (GLOVE) ×1
GOWN STRL REUS W/ TWL LRG LVL3 (GOWN DISPOSABLE) ×2 IMPLANT
GOWN STRL REUS W/TWL LRG LVL3 (GOWN DISPOSABLE) ×4
KIT PROCEDURE FLUENT (KITS) ×3 IMPLANT
KIT TURNOVER CYSTO (KITS) ×3 IMPLANT
PACK DNC HYST (MISCELLANEOUS) ×3 IMPLANT
PAD OB MATERNITY 4.3X12.25 (PERSONAL CARE ITEMS) ×3 IMPLANT
PAD PREP 24X41 OB/GYN DISP (PERSONAL CARE ITEMS) ×3 IMPLANT
SEAL ROD LENS SCOPE MYOSURE (ABLATOR) ×3 IMPLANT
SOL .9 NS 3000ML IRR  AL (IV SOLUTION) ×2
SOL .9 NS 3000ML IRR UROMATIC (IV SOLUTION) ×1 IMPLANT
TUBING CONNECTING 10 (TUBING) IMPLANT
TUBING CONNECTING 10' (TUBING)

## 2019-06-23 NOTE — Discharge Instructions (Addendum)
Hysteroscopy, Care After This sheet gives you information about how to care for yourself after your procedure. Your health care provider may also give you more specific instructions. If you have problems or questions, contact your health care provider. What can I expect after the procedure? After the procedure, it is common to have:  Cramping.  Bleeding. This can vary from light spotting to menstrual-like bleeding. Follow these instructions at home: Activity  Rest for 1-2 days after the procedure.  Do not douche, use tampons, or have sex for 2 weeks after the procedure, or until your health care provider approves.  Do not drive for 24 hours after the procedure, or for as long as told by your health care provider.  Do not drive, use heavy machinery, or drink alcohol while taking prescription pain medicines. Medicines   Take over-the-counter and prescription medicines only as told by your health care provider.  Do not take aspirin during recovery. It can increase the risk of bleeding. General instructions  Do not take baths, swim, or use a hot tub until your health care provider approves. Take showers instead of baths for 2 weeks, or for as long as told by your health care provider.  To prevent or treat constipation while you are taking prescription pain medicine, your health care provider may recommend that you: ? Drink enough fluid to keep your urine clear or pale yellow. ? Take over-the-counter or prescription medicines. ? Eat foods that are high in fiber, such as fresh fruits and vegetables, whole grains, and beans. ? Limit foods that are high in fat and processed sugars, such as fried and sweet foods.  Keep all follow-up visits as told by your health care provider. This is important. Contact a health care provider if:  You feel dizzy or lightheaded.  You feel nauseous.  You have abnormal vaginal discharge.  You have a rash.  You have pain that does not get better with  medicine.  You have chills. Get help right away if:  You have bleeding that is heavier than a normal menstrual period.  You have a fever.  You have pain or cramps that get worse.  You develop new abdominal pain.  You faint.  You have pain in your shoulders.  You have shortness of breath. Summary  After the procedure, you may have cramping and some vaginal bleeding.  Do not douche, use tampons, or have sex for 2 weeks after the procedure, or until your health care provider approves.  Do not take baths, swim, or use a hot tub until your health care provider approves. Take showers instead of baths for 2 weeks, or for as long as told by your health care provider.  Report any unusual symptoms to your health care provider.  Keep all follow-up visits as told by your health care provider. This is important. This information is not intended to replace advice given to you by your health care provider. Make sure you discuss any questions you have with your health care provider. Document Revised: 05/11/2017 Document Reviewed: 06/27/2016 Elsevier Patient Education  2020 Elsevier Inc.    Dilation and Curettage or Vacuum Curettage, Care After These instructions give you information about caring for yourself after your procedure. Your doctor may also give you more specific instructions. Call your doctor if you have any problems or questions after your procedure. Follow these instructions at home: Activity  Do not drive or use heavy machinery while taking prescription pain medicine.  For 24 hours after your   procedure, avoid driving.  Take short walks often, followed by rest periods. Ask your doctor what activities are safe for you. After one or two days, you may be able to return to your normal activities.  Do not lift anything that is heavier than 10 lb (4.5 kg) until your doctor approves.  For at least 2 weeks, or as long as told by your doctor: ? Do not douche. ? Do not use  tampons. ? Do not have sex. General instructions   Take over-the-counter and prescription medicines only as told by your doctor. This is very important if you take blood thinning medicine.  Do not take baths, swim, or use a hot tub until your doctor approves. Take showers instead of baths.  Wear compression stockings as told by your doctor.  It is up to you to get the results of your procedure. Ask your doctor when your results will be ready.  Keep all follow-up visits as told by your doctor. This is important. Contact a doctor if:  You have very bad cramps that get worse or do not get better with medicine.  You have very bad pain in your belly (abdomen).  You cannot drink fluids without throwing up (vomiting).  You get pain in a different part of the area between your belly and thighs (pelvis).  You have bad-smelling discharge from your vagina.  You have a rash. Get help right away if:  You are bleeding a lot from your vagina. A lot of bleeding means soaking more than one sanitary pad in an hour, for 2 hours in a row.  You have clumps of blood (blood clots) coming from your vagina.  You have a fever or chills.  Your belly feels very tender or hard.  You have chest pain.  You have trouble breathing.  You cough up blood.  You feel dizzy.  You feel light-headed.  You pass out (faint).  You have pain in your neck or shoulder area. Summary  Take short walks often, followed by rest periods. Ask your doctor what activities are safe for you. After one or two days, you may be able to return to your normal activities.  Do not lift anything that is heavier than 10 lb (4.5 kg) until your doctor approves.  Do not take baths, swim, or use a hot tub until your doctor approves. Take showers instead of baths.  Contact your doctor if you have any symptoms of infection, like bad-smelling discharge from your vagina. This information is not intended to replace advice given to  you by your health care provider. Make sure you discuss any questions you have with your health care provider. Document Revised: 05/11/2017 Document Reviewed: 02/14/2016 Elsevier Patient Education  2020 Elsevier Inc.    AMBULATORY SURGERY  DISCHARGE INSTRUCTIONS   1) The drugs that you were given will stay in your system until tomorrow so for the next 24 hours you should not:  A) Drive an automobile B) Make any legal decisions C) Drink any alcoholic beverage   2) You may resume regular meals tomorrow.  Today it is better to start with liquids and gradually work up to solid foods.  You may eat anything you prefer, but it is better to start with liquids, then soup and crackers, and gradually work up to solid foods.   3) Please notify your doctor immediately if you have any unusual bleeding, trouble breathing, redness and pain at the surgery site, drainage, fever, or pain not relieved by medication.      4) Additional Instructions:        Please contact your physician with any problems or Same Day Surgery at [phone], Monday through Friday 6 am to 4 pm, or [place] [facility] [place] [facility] number at [phone].  

## 2019-06-23 NOTE — Anesthesia Postprocedure Evaluation (Signed)
Anesthesia Post Note  Patient: Caitlyn Blackburn  Procedure(s) Performed: DILATATION & CURETTAGE/HYSTEROSCOPY WITH MYOSURE POLYPECTOMY (N/A )  Patient location during evaluation: PACU Anesthesia Type: General Level of consciousness: awake and alert Pain management: pain level controlled Vital Signs Assessment: post-procedure vital signs reviewed and stable Respiratory status: spontaneous breathing, nonlabored ventilation, respiratory function stable and patient connected to nasal cannula oxygen Cardiovascular status: blood pressure returned to baseline and stable Postop Assessment: no apparent nausea or vomiting Anesthetic complications: no     Last Vitals:  Vitals:   06/23/19 0912 06/23/19 0917  BP: 115/77 (!) 135/95  Pulse: 63 64  Resp: 15 16  Temp:  36.7 C  SpO2: 99% 100%    Last Pain:  Vitals:   06/23/19 0917  TempSrc: Temporal  PainSc: 2                  Corinda Gubler

## 2019-06-23 NOTE — Interval H&P Note (Signed)
History and Physical Interval Note:  06/23/2019 7:18 AM  Caitlyn Blackburn  has presented today for surgery, with the diagnosis of Intermentrual bleeding, Uterine mass (endometrial polyp vs fibroid).  The various methods of treatment have been discussed with the patient and family. After consideration of risks, benefits and other options for treatment, the patient has consented to  Procedure(s): DILATATION & CURETTAGE/HYSTEROSCOPY WITH MYOSURE (N/A) as a surgical intervention.  The patient's history has been reviewed, patient examined, no change in status, stable for surgery.  I have reviewed the patient's chart and labs.  Questions were answered to the patient's satisfaction.     Hildred Laser, MD [facility]

## 2019-06-23 NOTE — Anesthesia Preprocedure Evaluation (Signed)
Anesthesia Evaluation  Patient identified by MRN, date of birth, ID band Patient awake    Reviewed: Allergy & Precautions, NPO status , Patient's Chart, lab work & pertinent test results  History of Anesthesia Complications Negative for: history of anesthetic complications  Airway Mallampati: III  TM Distance: >3 FB Neck ROM: Full    Dental no notable dental hx. (+) Teeth Intact   Pulmonary neg pulmonary ROS, neg sleep apnea, neg COPD, Patient abstained from smoking.Not current smoker,    Pulmonary exam normal breath sounds clear to auscultation       Cardiovascular Exercise Tolerance: Good METS(-) hypertension(-) CAD and (-) Past MI negative cardio ROS  (-) dysrhythmias  Rhythm:Regular Rate:Normal - Systolic murmurs    Neuro/Psych negative neurological ROS  negative psych ROS   GI/Hepatic neg GERD  ,(+)     (-) substance abuse  ,   Endo/Other  neg diabetes  Renal/GU negative Renal ROS     Musculoskeletal   Abdominal   Peds  Hematology   Anesthesia Other Findings Past Medical History: No date: Abnormal Pap smear of vagina     Comment:  10 years ago No date: History of migraine  Reproductive/Obstetrics                             Anesthesia Physical Anesthesia Plan  ASA: I  Anesthesia Plan: General   Post-op Pain Management:    Induction: Intravenous  PONV Risk Score and Plan: 4 or greater and Ondansetron, Dexamethasone, Propofol infusion, TIVA and Midazolam  Airway Management Planned: LMA  Additional Equipment: None  Intra-op Plan:   Post-operative Plan: Extubation in OR  Informed Consent: I have reviewed the patients History and Physical, chart, labs and discussed the procedure including the risks, benefits and alternatives for the proposed anesthesia with the patient or authorized representative who has indicated his/her understanding and acceptance.     Dental  advisory given  Plan Discussed with: CRNA and Surgeon  Anesthesia Plan Comments: (Discussed risks of anesthesia with patient, including PONV, sore throat, lip/dental damage. Rare risks discussed as well, such as cardiorespiratory sequelae. Patient understands.)        Anesthesia Quick Evaluation

## 2019-06-23 NOTE — Anesthesia Procedure Notes (Signed)
Procedure Name: Intubation Performed by: Fletcher-Harrison, Milania Haubner, CRNA Pre-anesthesia Checklist: Patient identified, Emergency Drugs available, Suction available and Patient being monitored Patient Re-evaluated:Patient Re-evaluated prior to induction Oxygen Delivery Method: Circle system utilized Preoxygenation: Pre-oxygenation with 100% oxygen Induction Type: IV induction Ventilation: Mask ventilation without difficulty Laryngoscope Size: McGraph and 3 Grade View: Grade I Tube type: Oral Tube size: 6.5 mm Number of attempts: 1 Airway Equipment and Method: Stylet Placement Confirmation: ETT inserted through vocal cords under direct vision,  positive ETCO2,  CO2 detector and breath sounds checked- equal and bilateral Secured at: 22 cm Tube secured with: Tape Dental Injury: Teeth and Oropharynx as per pre-operative assessment        

## 2019-06-23 NOTE — Op Note (Signed)
Procedure(s): DILATATION & CURETTAGE/HYSTEROSCOPY WITH MYOSURE POLYPECTOMY Procedure Note  Caitlyn Blackburn female 33 y.o. 06/23/2019  Indications: The patient is a 34 y.o. G30P1011 female with intermenstrual spotting/bleeding, intrauterine mass on ultrasound (endometrial polyp vs submucosal fibroid)  Pre-operative Diagnosis: Intermenstrual bleeding, intrauterine mass (fibroid vs polyp)  Post-operative Diagnosis: Intermenstrual bleeding, endometrial polyp  Surgeon: Surgeon(s) and Role:    * Hildred Laser, MD - Primary   Assistants: None  Anesthesia: General endotracheal anesthesia   Procedure Details: Patient was seen in the preoperative holding area where the consent was reviewed. Patient was consented for the following procedure: Hysteroscopy Dilation and Curettage with Removal of Intrauterine Mass. She was taken to the operating room # 7.    The patient was then placed under general anesthesia without difficulty.  She was then prepped and draped in the normal sterile fashion, and placed in the dorsal lithotomy position.  A time out was performed.   Straight catheterization was performed. A sterile speculum was inserted into vagina. A single-tooth tenaculum was used to grasp the anterior lip of the cervix. Cervical dilation was performed. A 5 mm hysteroscope was introduced into the uterus under direct visualization. The cavity was allowed to fill, and then the entire cavity was explored with the findings described above. The Myosure device was then activated and a polypectomy was performed without difficulty. The hysteroscope and Myosure device were then removed, and a sharp curette was then passed into the uterus and endometrial sampling was collected for pathology.   The hysteroscope was then re-introduced for final survey, with visualization of the entire cavity. No other intracavitary lesions noted. The hysteroscope was removed from the patient's uterine cavity. An injection of 10 ml of  1% lidocaine was then performed circumferentially around the cervix for analgesia. The tenaculum was removed and excellent hemostasis was noted. The speculum was removed from the vagina.   All instrument and sponge counts were correct at the end of the procedure x 2.  The patient tolerated the procedure well.  She was awakened and taken to the PACU in stable condition.   Findings: Uterus sounded to 8 cm.   Moderately proliferative endometrium.  Tubal ostia were visualized bilaterally. Endometrial polyp present along right aspect of uterine wall.  Tubal ostia were identified bilaterally.   Estimated Blood Loss:  5 ml      Drains: straight catheterization with 100 mlat start of procedure.          Total IV Fluids:  600 ml  Specimens:  Endometrial curettings, endometrial polyp         Implants: None         Complications:  None; patient tolerated the procedure well.         Disposition: PACU - hemodynamically stable.         Condition: stable   Hildred Laser, MD [facility]

## 2019-06-23 NOTE — Transfer of Care (Signed)
Immediate Anesthesia Transfer of Care Note  Patient: Caitlyn Blackburn  Procedure(s) Performed: DILATATION & CURETTAGE/HYSTEROSCOPY WITH MYOSURE POLYPECTOMY (N/A )  Patient Location: PACU  Anesthesia Type:General  Level of Consciousness: sedated  Airway & Oxygen Therapy: Patient connected to face mask oxygen  Post-op Assessment: Post -op Vital signs reviewed and stable  Post vital signs: stable  Last Vitals:  Vitals Value Taken Time  BP 121/76 06/23/19 0843  Temp 36.2 C 06/23/19 0843  Pulse 100 06/23/19 0843  Resp 18 06/23/19 0843  SpO2 100 % 06/23/19 0843  Vitals shown include unvalidated device data.  Last Pain:  Vitals:   06/23/19 0613  TempSrc: Tympanic  PainSc: 0-No pain         Complications: No apparent anesthesia complications

## 2019-06-24 ENCOUNTER — Encounter: Payer: 59 | Admitting: Obstetrics and Gynecology

## 2019-06-24 LAB — TYPE AND SCREEN
ABO/RH(D): A POS
Antibody Screen: NEGATIVE

## 2019-06-24 LAB — ABO/RH: ABO/RH(D): A POS

## 2019-06-25 LAB — SURGICAL PATHOLOGY

## 2019-07-02 ENCOUNTER — Other Ambulatory Visit: Payer: Self-pay

## 2019-07-02 ENCOUNTER — Encounter: Payer: Self-pay | Admitting: Obstetrics and Gynecology

## 2019-07-02 ENCOUNTER — Ambulatory Visit (INDEPENDENT_AMBULATORY_CARE_PROVIDER_SITE_OTHER): Payer: 59 | Admitting: Obstetrics and Gynecology

## 2019-07-02 VITALS — BP 122/74 | HR 83 | Ht 63.0 in | Wt 149.0 lb

## 2019-07-02 DIAGNOSIS — N840 Polyp of corpus uteri: Secondary | ICD-10-CM

## 2019-07-02 DIAGNOSIS — N939 Abnormal uterine and vaginal bleeding, unspecified: Secondary | ICD-10-CM

## 2019-07-02 DIAGNOSIS — Z09 Encounter for follow-up examination after completed treatment for conditions other than malignant neoplasm: Secondary | ICD-10-CM

## 2019-07-02 NOTE — Progress Notes (Signed)
    OBSTETRICS/GYNECOLOGY POST-OPERATIVE CLINIC VISIT  Subjective:     Caitlyn Blackburn is a 34 y.o. female who presents to the clinic 1 weeks status post Hysteroscopy D&C with polypectomy for abnormal uterine bleeding and uterine polyp. Eating a regular diet without difficulty.  The patient is not having any pain.  Notes spotting for the first several days but has resolved.  Denies any abnormal discharge.   The following portions of the patient's history were reviewed and updated as appropriate: allergies, current medications, past family history, past medical history, past social history, past surgical history and problem list.  Review of Systems Pertinent items noted in HPI and remainder of comprehensive ROS otherwise negative.    Objective:    BP 122/74   Pulse 83   Ht 5\' 3"  (1.6 m)   Wt 149 lb (67.6 kg)   LMP 06/12/2019   BMI 26.39 kg/m  General:  alert and no distress  Pelvis:   deferred.     Pathology:   DIAGNOSIS:  A. ENDOMETRIAL POLYPS; POLYPECTOMY:  - FRAGMENTS OF ENDOMETRIAL POLYP WITH TUBAL METAPLASIA.  - NEGATIVE FOR ATYPIA / EIN AND MALIGNANCY.   B. ENDOMETRIUM; CURETTINGS:  - PROLIFERATIVE ENDOMETRIUM WITH TUBAL METAPLASIA AND FOCAL BREAKDOWN,  SUGGESTIVE OF ANOVULATORY CYCLE.  - NEGATIVE FOR ATYPIA / EIN AND MALIGNANCY.   Assessment:    Doing well postoperatively.  Abnormal uterine bleeding due to endometrial polyp  Plan:   1. Continue any current medications as needed. 2. Wound care discussed. 3. Operative findings again reviewed. Pathology report discussed. 4. Activity restrictions: none.  Answered questions regarding future fertility for patient.  5. Anticipated return to work: has already resumed working.  6. Follow up: as needed  for any gynecologic concerns.    06/14/2019, MD [facility]

## 2019-07-02 NOTE — Progress Notes (Signed)
Pt present for post-op check. Pt stated that she was doing well no problems.

## 2020-04-22 ENCOUNTER — Encounter: Payer: 59 | Admitting: Obstetrics and Gynecology

## 2020-06-23 ENCOUNTER — Ambulatory Visit (INDEPENDENT_AMBULATORY_CARE_PROVIDER_SITE_OTHER): Payer: 59 | Admitting: Obstetrics and Gynecology

## 2020-06-23 ENCOUNTER — Encounter: Payer: Self-pay | Admitting: Obstetrics and Gynecology

## 2020-06-23 ENCOUNTER — Other Ambulatory Visit: Payer: Self-pay

## 2020-06-23 ENCOUNTER — Other Ambulatory Visit (HOSPITAL_COMMUNITY)
Admission: RE | Admit: 2020-06-23 | Discharge: 2020-06-23 | Disposition: A | Discharge status: Home / Self Care | Payer: 59 | Source: Ambulatory Visit | Attending: Obstetrics and Gynecology | Admitting: Obstetrics and Gynecology

## 2020-06-23 VITALS — BP 129/81 | HR 97 | Ht 63.0 in | Wt 134.3 lb

## 2020-06-23 DIAGNOSIS — N63 Unspecified lump in unspecified breast: Secondary | ICD-10-CM

## 2020-06-23 DIAGNOSIS — Z01419 Encounter for gynecological examination (general) (routine) without abnormal findings: Secondary | ICD-10-CM | POA: Diagnosis not present

## 2020-06-23 DIAGNOSIS — Z124 Encounter for screening for malignant neoplasm of cervix: Secondary | ICD-10-CM | POA: Insufficient documentation

## 2020-06-23 NOTE — Progress Notes (Signed)
GYNECOLOGY ANNUAL PHYSICAL EXAM PROGRESS NOTE  Subjective:    Caitlyn Blackburn is a 35 y.o. G5P1011 female who presents for an annual exam.  The patient is sexually active.The patient wears seatbelts: yes. The patient participates in regular exercise: yes. Has the patient ever been transfused or tattooed?: no. The patient reports that there is not domestic violence in her life.   The patient has the following complaints today:  1. None.    Menstrual History:  Menarche age: 26 Patient's last menstrual period was 05/29/2020. Period Cycle (Days): 28 Period Duration (Days): 5 Period Pattern: Regular Menstrual Flow: Moderate Menstrual Control: Maxi pad,Tampon Menstrual Control Change Freq (Hours): 4-5 Dysmenorrhea: (!) Mild Dysmenorrhea Symptoms: Cramping   Gynecologic History Contraception: none.  History of STI's: Denies Last Pap: 04/10/2017. Results were: normal.  Reports remote h/o abnormal pap smear x 1.    Upstream - 06/23/20 1509      Pregnancy Intention Screening   Does the patient want to become pregnant in the next year? Ok Either Way    Does the patient's partner want to become pregnant in the next year? Ok Either Way    Would the patient like to discuss contraceptive options today? No      Contraception Wrap Up   Current Method No Method - Other Reason          The pregnancy intention screening data noted above was reviewed. Potential methods of contraception were discussed. The patient elected to proceed with Oral Contraceptive.   OB History  Gravida Para Term Preterm AB Living  2 1 1  0 1 1  SAB IAB Ectopic Multiple Live Births  1 0 0 0 1    # Outcome Date GA Lbr Len/2nd Weight Sex Delivery Anes PTL Lv  2 SAB 2018          1 Term 2013   7 lb 1.6 oz (3.221 kg) M Vag-Spont   LIV    Past Medical History:  Diagnosis Date  . Abnormal Pap smear of vagina    10 years ago  . History of migraine     Past Surgical History:  Procedure Laterality Date  .  DILATATION & CURETTAGE/HYSTEROSCOPY WITH MYOSURE N/A 06/23/2019   Procedure: DILATATION & CURETTAGE/HYSTEROSCOPY WITH MYOSURE POLYPECTOMY;  Surgeon: 08/21/2019, MD;  Location: [facility] ORS;  Service: Gynecology;  Laterality: N/A;  . DILATION AND CURETTAGE OF UTERUS  06/2019  . WISDOM TOOTH EXTRACTION      Family History  Problem Relation Age of Onset  . Alcohol abuse Maternal Grandfather   . Prostate cancer Maternal Grandfather   . Arthritis Maternal Grandmother   . Hyperlipidemia Maternal Grandmother   . Hypertension Maternal Grandmother   . Breast cancer Paternal Grandmother   . Heart disease Paternal Grandmother   . Breast cancer Maternal Aunt   . Healthy Mother   . Healthy Father   . Colon cancer Neg Hx   . Ovarian cancer Neg Hx   . Diabetes Neg Hx     Social History   Socioeconomic History  . Marital status: Married    Spouse name: Not on file  . Number of children: Not on file  . Years of education: Not on file  . Highest education level: Not on file  Occupational History  . Not on file  Tobacco Use  . Smoking status: Never Smoker  . Smokeless tobacco: Never Used  Vaping Use  . Vaping Use: Never used  Substance and Sexual Activity  .  Alcohol use: Yes    Comment: occ  . Drug use: No  . Sexual activity: Yes    Birth control/protection: None  Other Topics Concern  . Not on file  Social History Narrative  . Not on file   Social Determinants of Health   Financial Resource Strain: Not on file  Food Insecurity: Not on file  Transportation Needs: Not on file  Physical Activity: Not on file  Stress: Not on file  Social Connections: Not on file  Intimate Partner Violence: Not on file    Current Outpatient Medications on File Prior to Visit  Medication Sig Dispense Refill  . Prenatal Vit-Fe Fumarate-FA (PRENATAL MULTIVITAMIN) TABS tablet Take 1 tablet by mouth daily.     No current facility-administered medications on file prior to visit.    No Known  Allergies   Review of Systems Constitutional: negative for chills, fatigue, fevers and sweats Eyes: negative for irritation, redness and visual disturbance Ears, nose, mouth, throat, and face: negative for hearing loss, nasal congestion, snoring and tinnitus Respiratory: negative for asthma, cough, sputum Cardiovascular: negative for chest pain, dyspnea, exertional chest pressure/discomfort, irregular heart beat, palpitations and syncope Gastrointestinal: negative for abdominal pain, change in bowel habits, nausea and vomiting Genitourinary: negative for abnormal menstrual period  Genital lesions, sexual problems and vaginal discharge, dysuria and urinary incontinence Integument/breast: negative for breast lump, breast tenderness and nipple discharge Hematologic/lymphatic: negative for bleeding and easy bruising Musculoskeletal:negative for back pain and muscle weakness Neurological: negative for dizziness, headaches, vertigo and weakness Endocrine: negative for diabetic symptoms including polydipsia, polyuria and skin dryness Allergic/Immunologic: negative for hay fever and urticaria       Objective:  Blood pressure 129/81, pulse 97, height 5\' 3"  (1.6 m), weight 134 lb 4.8 oz (60.9 kg), last menstrual period 05/29/2020. Body mass index is 23.79 kg/m.  General Appearance:    Alert, cooperative, no distress, appears stated age  Head:    Normocephalic, without obvious abnormality, atraumatic  Eyes:    PERRL, conjunctiva/corneas clear, EOM's intact, both eyes  Ears:    Normal external ear canals, both ears  Nose:   Nares normal, septum midline, mucosa normal, no drainage or sinus tenderness  Throat:   Lips, mucosa, and tongue normal; teeth and gums normal  Neck:   Supple, symmetrical, trachea midline, no adenopathy; thyroid: no enlargement/tenderness/nodules; no carotid bruit or JVD  Back:     Symmetric, no curvature, ROM normal, no CVA tenderness  Lungs:     Clear to auscultation  bilaterally, respirations unlabored  Chest Wall:    No tenderness or deformity   Heart:    Regular rate and rhythm, S1 and S2 normal, no murmur, rub or gallop  Breast Exam:    No tenderness or nipple abnormality. Fibrous breast tissue noted bilaterally, with possible fibroadenoma between 2-3 o'clock, right breast.  Abdomen:     Soft, non-tender, bowel sounds active all four quadrants, no masses, no organomegaly.    Genitalia:    Pelvic:external genitalia normal, vagina without lesions, discharge, or tenderness, rectovaginal septum  normal. Cervix normal in appearance, no cervical motion tenderness, no adnexal masses or tenderness.  Uterus normal size, shape, mobile, regular contours, nontender.  Rectal:    Normal external sphincter.  No hemorrhoids appreciated. Internal exam not done.   Extremities:   Extremities normal, atraumatic, no cyanosis or edema  Pulses:   2+ and symmetric all extremities  Skin:   Skin color, texture, turgor normal, no rashes or lesions  Lymph nodes:  Cervical, supraclavicular, and axillary nodes normal  Neurologic:   CNII-XII intact, normal strength, sensation and reflexes throughout   .  Labs:  Lab Results  Component Value Date   WBC 7.2 04/22/2019   HGB 13.8 04/22/2019   HCT 41.6 04/22/2019   MCV 91 04/22/2019   PLT 222 04/22/2019    Lab Results  Component Value Date   CREATININE 0.61 04/22/2019   BUN 8 04/22/2019   NA 141 04/22/2019   K 4.1 04/22/2019   CL 102 04/22/2019   CO2 25 04/22/2019    Lab Results  Component Value Date   ALT 14 04/22/2019   AST 26 04/22/2019   ALKPHOS 89 04/22/2019   BILITOT 0.5 04/22/2019     Lab Results  Component Value Date   TSH 1.460 04/17/2018    Lab Results  Component Value Date   CHOL 191 04/17/2018   HDL 87 04/17/2018   LDLCALC 97 04/17/2018   TRIG 35 04/17/2018   CHOLHDL 2.2 04/17/2018     Assessment:    Healthy female exam.   Fibrous breast tissue.  Plan:    - Blood tests: CBC with diff  and Comprehensive metabolic panel (will return for labs as lab technician not in office today. - Breast self exam technique reviewed and patient encouraged to perform self-exam monthly.  Continue to monitor possible fibroadenoma of right breast. Given warning signs for when to return. - Patient reports remote history of cyst in contralateral breast.  - Contraception: none. Patient notes that she is not opposed to another pregnancy but also not actively trying to conceive.  - Discussed healthy lifestyle modifications. - Pap smear performed today. - Flu vaccine up to date.  - COVID vaccination status: has completed series and booster.  - Return in 1 year for annual exam.    Hildred Laser, MD [facility]

## 2020-06-23 NOTE — Patient Instructions (Signed)
Preventive Care 11-35 Years Old, Female Preventive care refers to lifestyle choices and visits with your health care provider that can promote health and wellness. This includes:  A yearly physical exam. This is also called an annual wellness visit.  Regular dental and eye exams.  Immunizations.  Screening for certain conditions.  Healthy lifestyle choices, such as: ? Eating a healthy diet. ? Getting regular exercise. ? Not using drugs or products that contain nicotine and tobacco. ? Limiting alcohol use. What can I expect for my preventive care visit? Physical exam Your health care provider may check your:  Height and weight. These may be used to calculate your BMI (body mass index). BMI is a measurement that tells if you are at a healthy weight.  Heart rate and blood pressure.  Body temperature.  Skin for abnormal spots. Counseling Your health care provider may ask you questions about your:  Past medical problems.  Family's medical history.  Alcohol, tobacco, and drug use.  Emotional well-being.  Home life and relationship well-being.  Sexual activity.  Diet, exercise, and sleep habits.  Work and work Statistician.  Access to firearms.  Method of birth control.  Menstrual cycle.  Pregnancy history. What immunizations do I need? Vaccines are usually given at various ages, according to a schedule. Your health care provider will recommend vaccines for you based on your age, medical history, and lifestyle or other factors, such as travel or where you work.   What tests do I need? Blood tests  Lipid and cholesterol levels. These may be checked every 5 years starting at age 64.  Hepatitis C test.  Hepatitis B test. Screening  Diabetes screening. This is done by checking your blood sugar (glucose) after you have not eaten for a while (fasting).  STD (sexually transmitted disease) testing, if you are at risk.  BRCA-related cancer screening. This may  be done if you have a family history of breast, ovarian, tubal, or peritoneal cancers.  Pelvic exam and Pap test. This may be done every 3 years starting at age 61. Starting at age 82, this may be done every 5 years if you have a Pap test in combination with an HPV test. Talk with your health care provider about your test results, treatment options, and if necessary, the need for more tests.   Follow these instructions at home: Eating and drinking  Eat a healthy diet that includes fresh fruits and vegetables, whole grains, lean protein, and low-fat dairy products.  Take vitamin and mineral supplements as recommended by your health care provider.  Do not drink alcohol if: ? Your health care provider tells you not to drink. ? You are pregnant, may be pregnant, or are planning to become pregnant.  If you drink alcohol: ? Limit how much you have to 0-1 drink a day. ? Be aware of how much alcohol is in your drink. In the [place] one drink equals one 12 oz bottle of beer (355 mL), one 5 oz glass of wine (148 mL), or one 1 oz glass of hard liquor (44 mL).   Lifestyle  Take daily care of your teeth and gums. Brush your teeth every morning and night with fluoride toothpaste. Floss one time each day.  Stay active. Exercise for at least 30 minutes 5 or more days each week.  Do not use any products that contain nicotine or tobacco, such as cigarettes, e-cigarettes, and chewing tobacco. If you need help quitting, ask your health care provider.  Do  not use drugs.  If you are sexually active, practice safe sex. Use a condom or other form of protection to prevent STIs (sexually transmitted infections).  If you do not wish to become pregnant, use a form of birth control. If you plan to become pregnant, see your health care provider for a prepregnancy visit.  Find healthy ways to cope with stress, such as: ? Meditation, yoga, or listening to music. ? Journaling. ? Talking to a trusted  person. ? Spending time with friends and family. Safety  Always wear your seat belt while driving or riding in a vehicle.  Do not drive: ? If you have been drinking alcohol. Do not ride with someone who has been drinking. ? When you are tired or distracted. ? While texting.  Wear a helmet and other protective equipment during sports activities.  If you have firearms in your house, make sure you follow all gun safety procedures.  Seek help if you have been physically or sexually abused. What's next?  Go to your health care provider once a year for an annual wellness visit.  Ask your health care provider how often you should have your eyes and teeth checked.  Stay up to date on all vaccines. This information is not intended to replace advice given to you by your health care provider. Make sure you discuss any questions you have with your health care provider. Document Revised: 01/25/2020 Document Reviewed: 02/07/2018 Elsevier Patient Education  2021 [employer].

## 2020-06-23 NOTE — Progress Notes (Signed)
Pt present for annual exam. Pt stated that she was doing well no problems.  

## 2020-06-28 LAB — CYTOLOGY - PAP
Comment: NEGATIVE
Diagnosis: NEGATIVE
High risk HPV: NEGATIVE

## 2020-06-30 ENCOUNTER — Other Ambulatory Visit: Payer: Self-pay

## 2020-06-30 ENCOUNTER — Other Ambulatory Visit: Payer: 59

## 2020-06-30 DIAGNOSIS — Z01419 Encounter for gynecological examination (general) (routine) without abnormal findings: Secondary | ICD-10-CM | POA: Diagnosis not present

## 2020-07-01 LAB — CBC
Hematocrit: 41.9 % (ref 34.0–46.6)
Hemoglobin: 13.8 g/dL (ref 11.1–15.9)
MCH: 30.9 pg (ref 26.6–33.0)
MCHC: 32.9 g/dL (ref 31.5–35.7)
MCV: 94 fL (ref 79–97)
Platelets: 260 10*3/uL (ref 150–450)
RBC: 4.46 x10E6/uL (ref 3.77–5.28)
RDW: 13.8 % (ref 11.7–15.4)
WBC: 5.4 10*3/uL (ref 3.4–10.8)

## 2020-07-01 LAB — COMPREHENSIVE METABOLIC PANEL
ALT: 19 IU/L (ref 0–32)
AST: 30 IU/L (ref 0–40)
Albumin/Globulin Ratio: 2 (ref 1.2–2.2)
Albumin: 4.5 g/dL (ref 3.8–4.8)
Alkaline Phosphatase: 72 IU/L (ref 44–121)
BUN/Creatinine Ratio: 17 (ref 9–23)
BUN: 10 mg/dL (ref 6–20)
Bilirubin Total: 0.3 mg/dL (ref 0.0–1.2)
CO2: 21 mmol/L (ref 20–29)
Calcium: 9.3 mg/dL (ref 8.7–10.2)
Chloride: 99 mmol/L (ref 96–106)
Creatinine, Ser: 0.6 mg/dL (ref 0.57–1.00)
GFR calc Af Amer: 138 mL/min/{1.73_m2} (ref >59)
GFR calc non Af Amer: 119 mL/min/{1.73_m2} (ref >59)
Globulin, Total: 2.2 g/dL (ref 1.5–4.5)
Glucose: 84 mg/dL (ref 65–99)
Potassium: 4.1 mmol/L (ref 3.5–5.2)
Sodium: 138 mmol/L (ref 134–144)
Total Protein: 6.7 g/dL (ref 6.0–8.5)

## 2021-06-28 ENCOUNTER — Ambulatory Visit (INDEPENDENT_AMBULATORY_CARE_PROVIDER_SITE_OTHER): Payer: 59 | Admitting: Obstetrics and Gynecology

## 2021-06-28 ENCOUNTER — Encounter: Payer: Self-pay | Admitting: Obstetrics and Gynecology

## 2021-06-28 ENCOUNTER — Other Ambulatory Visit: Payer: Self-pay

## 2021-06-28 VITALS — BP 130/82 | HR 82 | Ht 63.0 in | Wt 140.0 lb

## 2021-06-28 DIAGNOSIS — N63 Unspecified lump in unspecified breast: Secondary | ICD-10-CM | POA: Diagnosis not present

## 2021-06-28 DIAGNOSIS — Z01419 Encounter for gynecological examination (general) (routine) without abnormal findings: Secondary | ICD-10-CM

## 2021-06-28 DIAGNOSIS — Z13220 Encounter for screening for lipoid disorders: Secondary | ICD-10-CM | POA: Diagnosis not present

## 2021-06-28 NOTE — Progress Notes (Signed)
GYNECOLOGY ANNUAL PHYSICAL EXAM PROGRESS NOTE  Subjective:    Caitlyn Blackburn is a 36 y.o. G27P1011 female who presents for an annual exam.  The patient is sexually active.The patient wears seatbelts: yes. The patient participates in regular exercise: yes. Has the patient ever been transfused or tattooed?: no. The patient reports that there is not domestic violence in her life.   The patient has the following complaints today:  1. None.    Menstrual History:  Menarche age: 46 Patient's last menstrual period was 06/02/2021. Period Duration (Days): 5-6 Period Pattern: Regular Menstrual Flow: Moderate Menstrual Control: Tampon, Thin pad Dysmenorrhea: None   Gynecologic History Contraception: none.  History of STI's: Denies Last Pap: 06/23/2020. Results were: normal.  Reports remote h/o abnormal pap smear x 1.   The pregnancy intention screening data noted above was reviewed. Potential methods of contraception were discussed. The patient elected to proceed with No Contraceptive Precautions.   OB History  Gravida Para Term Preterm AB Living  2 1 1  0 1 1  SAB IAB Ectopic Multiple Live Births  1 0 0 0 1    # Outcome Date GA Lbr Len/2nd Weight Sex Delivery Anes PTL Lv  2 SAB 2018          1 Term 2013   7 lb 1.6 oz (3.221 kg) M Vag-Spont   LIV    Past Medical History:  Diagnosis Date   Abnormal Pap smear of vagina    10 years ago   History of migraine     Past Surgical History:  Procedure Laterality Date   DILATATION & CURETTAGE/HYSTEROSCOPY WITH MYOSURE N/A 06/23/2019   Procedure: DILATATION & CURETTAGE/HYSTEROSCOPY WITH MYOSURE POLYPECTOMY;  Surgeon: Rubie Maid, MD;  Location: [facility] ORS;  Service: Gynecology;  Laterality: N/A;   DILATION AND CURETTAGE OF UTERUS  06/2019   WISDOM TOOTH EXTRACTION      Family History  Problem Relation Age of Onset   Alcohol abuse Maternal Grandfather    Prostate cancer Maternal Grandfather    Arthritis Maternal Grandmother     Hyperlipidemia Maternal Grandmother    Hypertension Maternal Grandmother    Breast cancer Paternal Grandmother    Heart disease Paternal Grandmother    Breast cancer Maternal Aunt    Healthy Mother    Healthy Father    Colon cancer Neg Hx    Ovarian cancer Neg Hx    Diabetes Neg Hx     Social History   Socioeconomic History   Marital status: Married    Spouse name: Not on file   Number of children: Not on file   Years of education: Not on file   Highest education level: Not on file  Occupational History   Not on file  Tobacco Use   Smoking status: Never   Smokeless tobacco: Never  Vaping Use   Vaping Use: Never used  Substance and Sexual Activity   Alcohol use: Yes    Comment: occ   Drug use: No   Sexual activity: Yes    Birth control/protection: None  Other Topics Concern   Not on file  Social History Narrative   Not on file   Social Determinants of Health   Financial Resource Strain: Not on file  Food Insecurity: Not on file  Transportation Needs: Not on file  Physical Activity: Not on file  Stress: Not on file  Social Connections: Not on file  Intimate Partner Violence: Not on file    Current Outpatient Medications  on File Prior to Visit  Medication Sig Dispense Refill   Prenatal Vit-Fe Fumarate-FA (PRENATAL MULTIVITAMIN) TABS tablet Take 1 tablet by mouth daily.     No current facility-administered medications on file prior to visit.    No Known Allergies   Review of Systems Constitutional: negative for chills, fatigue, fevers and sweats Eyes: negative for irritation, redness and visual disturbance Ears, nose, mouth, throat, and face: negative for hearing loss, nasal congestion, snoring and tinnitus Respiratory: negative for asthma, cough, sputum Cardiovascular: negative for chest pain, dyspnea, exertional chest pressure/discomfort, irregular heart beat, palpitations and syncope Gastrointestinal: negative for abdominal pain, change in bowel habits,  nausea and vomiting Genitourinary: negative for abnormal menstrual period  Genital lesions, sexual problems and vaginal discharge, dysuria and urinary incontinence Integument/breast: negative for breast lump, breast tenderness and nipple discharge Hematologic/lymphatic: negative for bleeding and easy bruising Musculoskeletal:negative for back pain and muscle weakness Neurological: negative for dizziness, headaches, vertigo and weakness Endocrine: negative for diabetic symptoms including polydipsia, polyuria and skin dryness Allergic/Immunologic: negative for hay fever and urticaria       Objective:  Blood pressure 130/82, pulse 82, height 5\' 3"  (1.6 m), weight 140 lb (63.5 kg), last menstrual period 06/02/2021, SpO2 99 %. Body mass index is 24.8 kg/m.  General Appearance:    Alert, cooperative, no distress, appears stated age  Head:    Normocephalic, without obvious abnormality, atraumatic  Eyes:    PERRL, conjunctiva/corneas clear, EOM's intact, both eyes  Ears:    Normal external ear canals, both ears  Nose:   Nares normal, septum midline, mucosa normal, no drainage or sinus tenderness  Throat:   Lips, mucosa, and tongue normal; teeth and gums normal  Neck:   Supple, symmetrical, trachea midline, no adenopathy; thyroid: no enlargement/tenderness/nodules; no carotid bruit or JVD  Back:     Symmetric, no curvature, ROM normal, no CVA tenderness  Lungs:     Clear to auscultation bilaterally, respirations unlabored  Chest Wall:    No tenderness or deformity   Heart:    Regular rate and rhythm, S1 and S2 normal, no murmur, rub or gallop  Breast Exam:    No tenderness or nipple abnormality. Fibrous breast tissue noted bilaterally, with possible fibroadenoma between 2-3 o'clock, right breast.  Abdomen:     Soft, non-tender, bowel sounds active all four quadrants, no masses, no organomegaly.    Genitalia:    Pelvic:external genitalia normal, vagina without lesions, discharge, or tenderness,  rectovaginal septum  normal. Cervix normal in appearance, no cervical motion tenderness, no adnexal masses or tenderness.  Uterus normal size, shape, mobile, regular contours, nontender.  Rectal:    Normal external sphincter.  No hemorrhoids appreciated. Internal exam not done.   Extremities:   Extremities normal, atraumatic, no cyanosis or edema  Pulses:   2+ and symmetric all extremities  Skin:   Skin color, texture, turgor normal, no rashes or lesions  Lymph nodes:   Cervical, supraclavicular, and axillary nodes normal  Neurologic:   CNII-XII intact, normal strength, sensation and reflexes throughout   .  Labs:  Lab Results  Component Value Date   WBC 5.4 06/30/2020   HGB 13.8 06/30/2020   HCT 41.9 06/30/2020   MCV 94 06/30/2020   PLT 260 06/30/2020    Lab Results  Component Value Date   CREATININE 0.60 06/30/2020   BUN 10 06/30/2020   NA 138 06/30/2020   K 4.1 06/30/2020   CL 99 06/30/2020   CO2 21 06/30/2020  Lab Results  Component Value Date   ALT 19 06/30/2020   AST 30 06/30/2020   ALKPHOS 72 06/30/2020   BILITOT 0.3 06/30/2020     Lab Results  Component Value Date   TSH 1.460 04/17/2018    Lab Results  Component Value Date   CHOL 191 04/17/2018   HDL 87 04/17/2018   LDLCALC 97 04/17/2018   TRIG 35 04/17/2018   CHOLHDL 2.2 04/17/2018     Assessment:    Healthy female exam.  Fibrous breast tissue.  Plan:    - Blood tests: CBC, CMP, Lipid panel, TSH.  - Breast self exam technique reviewed and patient encouraged to perform self-exam monthly.  Continue to monitor possible fibroadenoma of right breast.  - Contraception: none. Patient notes that she is not opposed to another pregnancy but also not actively trying to conceive.  - Discussed healthy lifestyle modifications. - Pap smear up to date.  - Flu vaccine: up to date - COVID vaccination status: has completed series and booster.  - Return in 1 year for annual exam.    Rubie Maid,  MD [facility]

## 2021-06-29 LAB — COMPREHENSIVE METABOLIC PANEL WITH GFR
ALT: 14 [IU]/L (ref 0–32)
AST: 22 [IU]/L (ref 0–40)
Albumin/Globulin Ratio: 2.2 (ref 1.2–2.2)
Albumin: 4.7 g/dL (ref 3.8–4.8)
Alkaline Phosphatase: 68 [IU]/L (ref 44–121)
BUN/Creatinine Ratio: 15 (ref 9–23)
BUN: 9 mg/dL (ref 6–20)
Bilirubin Total: 0.4 mg/dL (ref 0.0–1.2)
CO2: 22 mmol/L (ref 20–29)
Calcium: 9.6 mg/dL (ref 8.7–10.2)
Chloride: 100 mmol/L (ref 96–106)
Creatinine, Ser: 0.62 mg/dL (ref 0.57–1.00)
Globulin, Total: 2.1 g/dL (ref 1.5–4.5)
Glucose: 77 mg/dL (ref 70–99)
Potassium: 4.1 mmol/L (ref 3.5–5.2)
Sodium: 137 mmol/L (ref 134–144)
Total Protein: 6.8 g/dL (ref 6.0–8.5)
eGFR: 119 mL/min/{1.73_m2}

## 2021-06-29 LAB — CBC
Hematocrit: 42.1 % (ref 34.0–46.6)
Hemoglobin: 13.8 g/dL (ref 11.1–15.9)
MCH: 29.9 pg (ref 26.6–33.0)
MCHC: 32.8 g/dL (ref 31.5–35.7)
MCV: 91 fL (ref 79–97)
Platelets: 234 10*3/uL (ref 150–450)
RBC: 4.61 x10E6/uL (ref 3.77–5.28)
RDW: 12.6 % (ref 11.7–15.4)
WBC: 7.3 10*3/uL (ref 3.4–10.8)

## 2021-06-29 LAB — LIPID PANEL
Chol/HDL Ratio: 2.4 ratio (ref 0.0–4.4)
Cholesterol, Total: 216 mg/dL — ABNORMAL HIGH (ref 100–199)
HDL: 89 mg/dL
LDL Chol Calc (NIH): 118 mg/dL — ABNORMAL HIGH (ref 0–99)
Triglycerides: 53 mg/dL (ref 0–149)
VLDL Cholesterol Cal: 9 mg/dL (ref 5–40)

## 2021-06-29 LAB — TSH: TSH: 1.45 u[IU]/mL (ref 0.450–4.500)

## 2022-07-06 ENCOUNTER — Ambulatory Visit (INDEPENDENT_AMBULATORY_CARE_PROVIDER_SITE_OTHER): Payer: 59 | Admitting: Obstetrics and Gynecology

## 2022-07-06 ENCOUNTER — Encounter: Payer: Self-pay | Admitting: Obstetrics and Gynecology

## 2022-07-06 VITALS — BP 119/81 | HR 78 | Resp 15 | Ht 63.0 in | Wt 143.6 lb

## 2022-07-06 DIAGNOSIS — N63 Unspecified lump in unspecified breast: Secondary | ICD-10-CM

## 2022-07-06 DIAGNOSIS — Z01419 Encounter for gynecological examination (general) (routine) without abnormal findings: Secondary | ICD-10-CM

## 2022-07-06 DIAGNOSIS — Z23 Encounter for immunization: Secondary | ICD-10-CM

## 2022-07-06 DIAGNOSIS — Z01411 Encounter for gynecological examination (general) (routine) with abnormal findings: Secondary | ICD-10-CM

## 2022-07-06 NOTE — Progress Notes (Signed)
GYNECOLOGY ANNUAL PHYSICAL EXAM PROGRESS NOTE  Subjective:    Caitlyn Blackburn is a 37 y.o. G97P1011 female who presents for an annual exam. The patient has no complaints today.  The patient is sexually active.The patient wears seatbelts: yes. The patient participates in regular exercise: yes. Has the patient ever been transfused or tattooed?: yes tattoo. The patient reports that there is not domestic violence in her life.   Menstrual History: Menarche age: 72 Patient's last menstrual period was 06/02/2022 (exact date). Period Cycle (Days): 30 Period Duration (Days): 5 Period Pattern: Regular Menstrual Flow: Light, Moderate Dysmenorrhea: None   Gynecologic History:  Contraception: none History of STI's: Denies Last Pap: 06/23/2020. Results were: normal. Notes h/o abnormal pap smears.    Upstream - 07/06/22 1418       Pregnancy Intention Screening   Does the patient want to become pregnant in the next year? No    Does the patient's partner want to become pregnant in the next year? No    Would the patient like to discuss contraceptive options today? No      Contraception Wrap Up   Current Method No Contraceptive Precautions    End Method No Contraception Precautions            The pregnancy intention screening data noted above was reviewed. Potential methods of contraception were discussed. The patient elected to proceed with No Contraception Precautions.   OB History  Gravida Para Term Preterm AB Living  2 1 1  0 1 1  SAB IAB Ectopic Multiple Live Births  1 0 0 0 1    # Outcome Date GA Lbr Len/2nd Weight Sex Delivery Anes PTL Lv  2 SAB 2018          1 Term 2013   7 lb 1.6 oz (3.221 kg) M Vag-Spont   LIV    Past Medical History:  Diagnosis Date   Abnormal Pap smear of vagina    10 years ago   History of migraine     Past Surgical History:  Procedure Laterality Date   DILATATION & CURETTAGE/HYSTEROSCOPY WITH MYOSURE N/A 06/23/2019   Procedure: DILATATION &  CURETTAGE/HYSTEROSCOPY WITH MYOSURE POLYPECTOMY;  Surgeon: 08/21/2019, MD;  Location: [facility];  Service: Gynecology;  Laterality: N/A;   DILATION AND CURETTAGE OF UTERUS  06/2019   WISDOM TOOTH EXTRACTION      Family History  Problem Relation Age of Onset   Alcohol abuse Maternal Grandfather    Prostate cancer Maternal Grandfather    Arthritis Maternal Grandmother    Hyperlipidemia Maternal Grandmother    Hypertension Maternal Grandmother    Breast cancer Paternal Grandmother    Heart disease Paternal Grandmother    Breast cancer Maternal Aunt    Healthy Mother    Healthy Father    Colon cancer Neg Hx    Ovarian cancer Neg Hx    Diabetes Neg Hx     Social History   Socioeconomic History   Marital status: Married    Spouse name: Not on file   Number of children: Not on file   Years of education: Not on file   Highest education level: Not on file  Occupational History   Not on file  Tobacco Use   Smoking status: Never   Smokeless tobacco: Never  Vaping Use   Vaping Use: Never used  Substance and Sexual Activity   Alcohol use: Yes    Comment: occ   Drug use: No   Sexual  activity: Yes    Birth control/protection: None  Other Topics Concern   Not on file  Social History Narrative   Not on file   Social Determinants of Health   Financial Resource Strain: Not on file  Food Insecurity: Not on file  Transportation Needs: Not on file  Physical Activity: Not on file  Stress: Not on file  Social Connections: Not on file  Intimate Partner Violence: Not on file    Current Outpatient Medications on File Prior to Visit  Medication Sig Dispense Refill   Prenatal Vit-Fe Fumarate-FA (PRENATAL MULTIVITAMIN) TABS tablet Take 1 tablet by mouth daily.     No current facility-administered medications on file prior to visit.    No Known Allergies   Review of Systems Constitutional: negative for chills, fatigue, fevers and sweats Eyes: negative for irritation, redness  and visual disturbance Ears, nose, mouth, throat, and face: negative for hearing loss, nasal congestion, snoring and tinnitus Respiratory: negative for asthma, cough, sputum Cardiovascular: negative for chest pain, dyspnea, exertional chest pressure/discomfort, irregular heart beat, palpitations and syncope Gastrointestinal: negative for abdominal pain, change in bowel habits, nausea and vomiting Genitourinary: negative for abnormal menstrual periods, genital lesions, sexual problems and vaginal discharge, dysuria and urinary incontinence Integument/breast: negative for breast lump, breast tenderness and nipple discharge Hematologic/lymphatic: negative for bleeding and easy bruising Musculoskeletal:negative for back pain and muscle weakness Neurological: negative for dizziness, headaches, vertigo and weakness Endocrine: negative for diabetic symptoms including polydipsia, polyuria and skin dryness Allergic/Immunologic: negative for hay fever and urticaria      Objective:  Blood pressure 119/81, pulse 78, resp. rate 15, height 5\' 3"  (1.6 m), weight 143 lb 9.6 oz (65.1 kg), last menstrual period 06/02/2022, SpO2 100 %. Body mass index is 25.44 kg/m.    General Appearance:    Alert, cooperative, no distress, appears stated age  Head:    Normocephalic, without obvious abnormality, atraumatic  Eyes:    PERRL, conjunctiva/corneas clear, EOM's intact, both eyes  Ears:    Normal external ear canals, both ears  Nose:   Nares normal, septum midline, mucosa normal, no drainage or sinus tenderness  Throat:   Lips, mucosa, and tongue normal; teeth and gums normal  Neck:   Supple, symmetrical, trachea midline, no adenopathy; thyroid: no enlargement/tenderness/nodules; no carotid bruit or JVD  Back:     Symmetric, no curvature, ROM normal, no CVA tenderness  Lungs:     Clear to auscultation bilaterally, respirations unlabored  Chest Wall:    No tenderness or deformity   Heart:    Regular rate and  rhythm, S1 and S2 normal, no murmur, rub or gallop  Breast Exam:    No tenderness, masses, or nipple abnormality  Abdomen:     Soft, non-tender, bowel sounds active all four quadrants, no masses, no organomegaly.    Genitalia:    Pelvic:external genitalia normal, vagina without lesions, discharge, or tenderness, rectovaginal septum  normal. Cervix normal in appearance, no cervical motion tenderness, no adnexal masses or tenderness.  Uterus normal size, shape, mobile, regular contours, nontender.  Rectal:    Normal external sphincter.  No hemorrhoids appreciated. Internal exam not done.   Extremities:   Extremities normal, atraumatic, no cyanosis or edema  Pulses:   2+ and symmetric all extremities  Skin:   Skin color, texture, turgor normal, no rashes or lesions  Lymph nodes:   Cervical, supraclavicular, and axillary nodes normal  Neurologic:   CNII-XII intact, normal strength, sensation and reflexes throughout   .  Labs:  Lab Results  Component Value Date   WBC 7.3 06/28/2021   HGB 13.8 06/28/2021   HCT 42.1 06/28/2021   MCV 91 06/28/2021   PLT 234 06/28/2021    Lab Results  Component Value Date   CREATININE 0.62 06/28/2021   BUN 9 06/28/2021   NA 137 06/28/2021   K 4.1 06/28/2021   CL 100 06/28/2021   CO2 22 06/28/2021    Lab Results  Component Value Date   ALT 14 06/28/2021   AST 22 06/28/2021   ALKPHOS 68 06/28/2021   BILITOT 0.4 06/28/2021    Lab Results  Component Value Date   TSH 1.450 06/28/2021     Assessment:   1. Well woman exam with routine gynecological exam   2. Fibrous breast lumps   3. Need for Tdap vaccination      Plan:  Blood tests: CBC with diff and Comprehensive metabolic panel. Breast self exam technique reviewed and patient encouraged to perform self-exam monthly. Contraception: none. Ambivalent if pregnancy occurs.  Discussed healthy lifestyle modifications. Mammogram  : Not age appropriate, to begin screens at age 89.  Pap smear  up  to date . Flu: Up to date.  Tdap: done today.  Follow up in 1 year for annual exam   Rubie Maid, MD [place]

## 2022-07-07 ENCOUNTER — Encounter: Payer: Self-pay | Admitting: Obstetrics and Gynecology

## 2022-07-07 LAB — CBC
Hematocrit: 36.7 % (ref 34.0–46.6)
Hemoglobin: 12.2 g/dL (ref 11.1–15.9)
MCH: 29.8 pg (ref 26.6–33.0)
MCHC: 33.2 g/dL (ref 31.5–35.7)
MCV: 90 fL (ref 79–97)
Platelets: 248 10*3/uL (ref 150–450)
RBC: 4.1 x10E6/uL (ref 3.77–5.28)
RDW: 13 % (ref 11.7–15.4)
WBC: 5.7 10*3/uL (ref 3.4–10.8)

## 2022-07-07 LAB — COMPREHENSIVE METABOLIC PANEL
ALT: 13 IU/L (ref 0–32)
AST: 21 IU/L (ref 0–40)
Albumin/Globulin Ratio: 2 (ref 1.2–2.2)
Albumin: 4.3 g/dL (ref 3.9–4.9)
Alkaline Phosphatase: 70 IU/L (ref 44–121)
BUN/Creatinine Ratio: 26 — ABNORMAL HIGH (ref 9–23)
BUN: 17 mg/dL (ref 6–20)
Bilirubin Total: 0.4 mg/dL (ref 0.0–1.2)
CO2: 23 mmol/L (ref 20–29)
Calcium: 9.2 mg/dL (ref 8.7–10.2)
Chloride: 103 mmol/L (ref 96–106)
Creatinine, Ser: 0.65 mg/dL (ref 0.57–1.00)
Globulin, Total: 2.1 g/dL (ref 1.5–4.5)
Glucose: 85 mg/dL (ref 70–99)
Potassium: 4.4 mmol/L (ref 3.5–5.2)
Sodium: 140 mmol/L (ref 134–144)
Total Protein: 6.4 g/dL (ref 6.0–8.5)
eGFR: 117 mL/min/{1.73_m2} (ref >59)

## 2023-08-21 NOTE — Progress Notes (Unsigned)
 GYNECOLOGY ANNUAL PHYSICAL EXAM PROGRESS NOTE  Subjective:    Caitlyn Blackburn is a 38 y.o. G34P1011 female who presents for an annual exam.  The patient is sexually active.The patient wears seatbelts: yes. The patient participates in regular exercise: yes. Has the patient ever been transfused or tattooed?: yes tattoo. The patient reports that there is not domestic violence in her life.    The patient has the following complaints today.  She reports that her cycle has decreased, she normally has a cycle for at least 5 days, now it only stays on for 2 days.  She has severe migraines at least 1-2 a month, she said they are debilitating, she is not able to do anything when she gets a migraine. She has a history of migraines in the past and was taking Imitrex, she does not have any medication that manages her migraines. Not typically associated with her cycles  Lastly,  complains of fatigue, states she has to drink a energy drink to get out the recliner, can't do house work and she doesn't feel like working. Has changed her exercise routine (decreased), as she thought she was pushing herself too hard. Her husband complains of decreased libido as well.    Menstrual History: Menarche age: 35 Patient's last menstrual period was 06/02/2022 (exact date). Period Cycle (Days): 30 Period Duration (Days): 5 Period Pattern: Regular Menstrual Flow: Light, Moderate Dysmenorrhea: None     Gynecologic History:  Contraception: none History of STI's: Denies Last Pap: 06/23/2020. Results were: normal. Notes h/o abnormal pap smears.    OB History  Gravida Para Term Preterm AB Living  2 1 1  0 1 1  SAB IAB Ectopic Multiple Live Births  1 0 0 0 1    # Outcome Date GA Lbr Len/2nd Weight Sex Type Anes PTL Lv  2 SAB 2018          1 Term 2013   7 lb 1.6 oz (3.221 kg) M Vag-Spont   LIV    Past Medical History:  Diagnosis Date   Abnormal Pap smear of vagina    10 years ago   History of migraine      Past Surgical History:  Procedure Laterality Date   DILATATION & CURETTAGE/HYSTEROSCOPY WITH MYOSURE N/A 06/23/2019   Procedure: DILATATION & CURETTAGE/HYSTEROSCOPY WITH MYOSURE POLYPECTOMY;  Surgeon: Hildred Laser, MD;  Location: [facility];  Service: Gynecology;  Laterality: N/A;   DILATION AND CURETTAGE OF UTERUS  06/2019   WISDOM TOOTH EXTRACTION      Family History  Problem Relation Age of Onset   Alcohol abuse Maternal Grandfather    Prostate cancer Maternal Grandfather    Arthritis Maternal Grandmother    Hyperlipidemia Maternal Grandmother    Hypertension Maternal Grandmother    Breast cancer Paternal Grandmother    Heart disease Paternal Grandmother    Breast cancer Maternal Aunt    Healthy Mother    Healthy Father    Colon cancer Neg Hx    Ovarian cancer Neg Hx    Diabetes Neg Hx     Social History   Socioeconomic History   Marital status: Married    Spouse name: Not on file   Number of children: Not on file   Years of education: Not on file   Highest education level: Not on file  Occupational History   Not on file  Tobacco Use   Smoking status: Never   Smokeless tobacco: Never  Vaping Use   Vaping status:  Never Used  Substance and Sexual Activity   Alcohol use: Yes    Comment: occ   Drug use: No   Sexual activity: Yes    Birth control/protection: None  Other Topics Concern   Not on file  Social History Narrative   Not on file   Social Drivers of Health   Financial Resource Strain: Not on file  Food Insecurity: Not on file  Transportation Needs: Not on file  Physical Activity: Not on file  Stress: Not on file  Social Connections: Not on file  Intimate Partner Violence: Not on file    Current Outpatient Medications on File Prior to Visit  Medication Sig Dispense Refill   Prenatal Vit-Fe Fumarate-FA (PRENATAL MULTIVITAMIN) TABS tablet Take 1 tablet by mouth daily.     No current facility-administered medications on file prior to visit.     No Known Allergies   Review of Systems Constitutional: negative for chills, fatigue, fevers and sweats Eyes: negative for irritation, redness and visual disturbance Ears, nose, mouth, throat, and face: negative for hearing loss, nasal congestion, snoring and tinnitus Respiratory: negative for asthma, cough, sputum Cardiovascular: negative for chest pain, dyspnea, exertional chest pressure/discomfort, irregular heart beat, palpitations and syncope Gastrointestinal: negative for abdominal pain, change in bowel habits, nausea and vomiting Genitourinary: negative for abnormal menstrual periods, genital lesions, sexual problems and vaginal discharge, dysuria and urinary incontinence Integument/breast: negative for breast lump, breast tenderness and nipple discharge Hematologic/lymphatic: negative for bleeding and easy bruising Musculoskeletal:negative for back pain and muscle weakness Neurological: negative for dizziness, headaches, vertigo and weakness Endocrine: negative for diabetic symptoms including polydipsia, polyuria and skin dryness Allergic/Immunologic: negative for hay fever and urticaria      Objective:  Blood pressure 126/74, height 5\' 3"  (1.6 m), weight 152 lb (68.9 kg), last menstrual period 08/15/2023.  Body mass index is 25.44 kg/m.    General Appearance:    Alert, cooperative, no distress, appears stated age  Head:    Normocephalic, without obvious abnormality, atraumatic  Eyes:    PERRL, conjunctiva/corneas clear, EOM's intact, both eyes  Ears:    Normal external ear canals, both ears  Nose:   Nares normal, septum midline, mucosa normal, no drainage or sinus tenderness  Throat:   Lips, mucosa, and tongue normal; teeth and gums normal  Neck:   Supple, symmetrical, trachea midline, no adenopathy; thyroid: no enlargement/tenderness/nodules; no carotid bruit or JVD  Back:     Symmetric, no curvature, ROM normal, no CVA tenderness  Lungs:     Clear to auscultation  bilaterally, respirations unlabored  Chest Wall:    No tenderness or deformity   Heart:    Regular rate and rhythm, S1 and S2 normal, no murmur, rub or gallop  Breast Exam:    No tenderness, or nipple abnormality, fibrocystic breast changes present, R>L.   Abdomen:     Soft, non-tender, bowel sounds active all four quadrants, no masses, no organomegaly.    Genitalia:    Pelvic:external genitalia normal, vagina without lesions, or tenderness, scant thin white discharge in vault. Rectovaginal septum  normal. Cervix normal in appearance, no cervical motion tenderness, no adnexal masses or tenderness.  Uterus normal size, shape, mobile, regular contours, nontender.  Rectal:    Normal external sphincter.  No hemorrhoids appreciated. Internal exam not done.   Extremities:   Extremities normal, atraumatic, no cyanosis or edema  Pulses:   2+ and symmetric all extremities  Skin:   Skin color, texture, turgor normal, no rashes or  lesions  Lymph nodes:   Cervical, supraclavicular, and axillary nodes normal  Neurologic:   CNII-XII intact, normal strength, sensation and reflexes throughout   .  Labs:  Lab Results  Component Value Date   WBC 5.7 07/06/2022   HGB 12.2 07/06/2022   HCT 36.7 07/06/2022   MCV 90 07/06/2022   PLT 248 07/06/2022    Lab Results  Component Value Date   CREATININE 0.65 07/06/2022   BUN 17 07/06/2022   NA 140 07/06/2022   K 4.4 07/06/2022   CL 103 07/06/2022   CO2 23 07/06/2022    Lab Results  Component Value Date   ALT 13 07/06/2022   AST 21 07/06/2022   ALKPHOS 70 07/06/2022   BILITOT 0.4 07/06/2022    Lab Results  Component Value Date   TSH 1.450 06/28/2021     Assessment:   1. Well woman exam with routine gynecological exam   2. Fatigue, unspecified type   3. Decreased libido   4. Fibrous breast lumps   5. History of migraine headaches      Plan:  Blood tests: Pending. Breast self exam technique reviewed and patient encouraged to perform  self-exam monthly. Contraception:  None . Declines use.  Discussed healthy lifestyle modifications. Mammogram  : not age appropriate. To begin screens at age 76. Has fibrocystic breast changes.  Pap smear  UTD . Will order labs to assess for organic causes of fatigue. Encouraged use of Vitamin B complex for energy.  History of migraines, used Imitrex in the past. Can prescribe.  Advised that menstrual cycles although changed, are still wnl.  Flu vaccine: Declined Follow up in 1 year for annual exam   Hildred Laser, MD [facility]

## 2023-08-22 ENCOUNTER — Ambulatory Visit: Payer: 59 | Admitting: Obstetrics and Gynecology

## 2023-08-22 ENCOUNTER — Other Ambulatory Visit: Payer: Self-pay

## 2023-08-22 ENCOUNTER — Encounter: Payer: Self-pay | Admitting: Obstetrics and Gynecology

## 2023-08-22 VITALS — BP 126/74 | Ht 63.0 in | Wt 152.0 lb

## 2023-08-22 DIAGNOSIS — Z01419 Encounter for gynecological examination (general) (routine) without abnormal findings: Secondary | ICD-10-CM

## 2023-08-22 DIAGNOSIS — R5383 Other fatigue: Secondary | ICD-10-CM | POA: Diagnosis not present

## 2023-08-22 DIAGNOSIS — Z8669 Personal history of other diseases of the nervous system and sense organs: Secondary | ICD-10-CM

## 2023-08-22 DIAGNOSIS — R6882 Decreased libido: Secondary | ICD-10-CM

## 2023-08-22 DIAGNOSIS — N63 Unspecified lump in unspecified breast: Secondary | ICD-10-CM

## 2023-08-22 MED ORDER — SUMATRIPTAN SUCCINATE 100 MG PO TABS
100.0000 mg | ORAL_TABLET | Freq: Once | ORAL | 11 refills | Status: AC | PRN
  Filled 2023-08-22: qty 9, 30d supply, fill #0

## 2023-08-24 LAB — CBC
Hematocrit: 38.2 % (ref 34.0–46.6)
Hemoglobin: 12.5 g/dL (ref 11.1–15.9)
MCH: 30.4 pg (ref 26.6–33.0)
MCHC: 32.7 g/dL (ref 31.5–35.7)
MCV: 93 fL (ref 79–97)
Platelets: 291 10*3/uL (ref 150–450)
RBC: 4.11 x10E6/uL (ref 3.77–5.28)
RDW: 12.4 % (ref 11.7–15.4)
WBC: 5.7 10*3/uL (ref 3.4–10.8)

## 2023-08-24 LAB — COMPREHENSIVE METABOLIC PANEL
ALT: 15 IU/L (ref 0–32)
AST: 25 IU/L (ref 0–40)
Albumin: 4.1 g/dL (ref 3.9–4.9)
Alkaline Phosphatase: 76 IU/L (ref 44–121)
BUN/Creatinine Ratio: 28 — ABNORMAL HIGH (ref 9–23)
BUN: 20 mg/dL (ref 6–20)
Bilirubin Total: <0.2 mg/dL (ref 0.0–1.2)
CO2: 25 mmol/L (ref 20–29)
Calcium: 8.8 mg/dL (ref 8.7–10.2)
Chloride: 102 mmol/L (ref 96–106)
Creatinine, Ser: 0.71 mg/dL (ref 0.57–1.00)
Globulin, Total: 2.1 g/dL (ref 1.5–4.5)
Glucose: 120 mg/dL — ABNORMAL HIGH (ref 70–99)
Potassium: 4.2 mmol/L (ref 3.5–5.2)
Sodium: 140 mmol/L (ref 134–144)
Total Protein: 6.2 g/dL (ref 6.0–8.5)
eGFR: 112 mL/min/{1.73_m2} (ref >59)

## 2023-08-24 LAB — TSH: TSH: 1.29 u[IU]/mL (ref 0.450–4.500)

## 2023-08-24 LAB — VITAMIN D 25 HYDROXY (VIT D DEFICIENCY, FRACTURES): Vit D, 25-Hydroxy: 26.3 ng/mL — ABNORMAL LOW (ref 30.0–100.0)

## 2023-08-24 LAB — MAGNESIUM: Magnesium: 2 mg/dL (ref 1.6–2.3)

## 2023-08-24 LAB — FOLATE: Folate: >20 ng/mL (ref >3.0)

## 2023-08-24 LAB — VITAMIN B12: Vitamin B-12: 904 pg/mL (ref 232–1245)

## 2023-08-24 LAB — ZINC: Zinc: 47 ug/dL (ref 44–115)

## 2023-08-27 ENCOUNTER — Encounter: Payer: Self-pay | Admitting: Obstetrics and Gynecology
